# Patient Record
Sex: Female | Born: 1950 | Race: White | Hispanic: No | Smoking: Current every day smoker
Health system: Southern US, Community
[De-identification: ages and names within clinical notes are randomized; demographics above are authoritative.]

## PROBLEM LIST (undated history)

## (undated) DIAGNOSIS — E785 Hyperlipidemia, unspecified: Secondary | ICD-10-CM

## (undated) DIAGNOSIS — R7303 Prediabetes: Secondary | ICD-10-CM

## (undated) DIAGNOSIS — Z972 Presence of dental prosthetic device (complete) (partial): Secondary | ICD-10-CM

## (undated) DIAGNOSIS — F419 Anxiety disorder, unspecified: Secondary | ICD-10-CM

## (undated) DIAGNOSIS — I10 Essential (primary) hypertension: Secondary | ICD-10-CM

## (undated) DIAGNOSIS — F32A Depression, unspecified: Secondary | ICD-10-CM

## (undated) DIAGNOSIS — M12811 Other specific arthropathies, not elsewhere classified, right shoulder: Secondary | ICD-10-CM

## (undated) DIAGNOSIS — Z8489 Family history of other specified conditions: Secondary | ICD-10-CM

## (undated) DIAGNOSIS — K08109 Complete loss of teeth, unspecified cause, unspecified class: Secondary | ICD-10-CM

## (undated) DIAGNOSIS — Z9889 Other specified postprocedural states: Secondary | ICD-10-CM

## (undated) DIAGNOSIS — Z872 Personal history of diseases of the skin and subcutaneous tissue: Secondary | ICD-10-CM

## (undated) DIAGNOSIS — C519 Malignant neoplasm of vulva, unspecified: Secondary | ICD-10-CM

## (undated) DIAGNOSIS — Z85828 Personal history of other malignant neoplasm of skin: Secondary | ICD-10-CM

## (undated) HISTORY — PX: MULTIPLE TOOTH EXTRACTIONS: SHX2053

## (undated) HISTORY — PX: CARPAL TUNNEL RELEASE: SHX101

## (undated) HISTORY — PX: TYMPANOPLASTY: SHX33

---

## 1997-07-18 ENCOUNTER — Other Ambulatory Visit: Admission: RE | Admit: 1997-07-18 | Discharge: 1997-07-18 | Payer: Self-pay | Admitting: Family Medicine

## 2001-07-20 ENCOUNTER — Encounter: Payer: Self-pay | Admitting: Family Medicine

## 2001-07-20 ENCOUNTER — Ambulatory Visit (HOSPITAL_COMMUNITY): Admission: RE | Admit: 2001-07-20 | Discharge: 2001-07-20 | Payer: Self-pay | Admitting: Family Medicine

## 2002-08-02 ENCOUNTER — Ambulatory Visit (HOSPITAL_COMMUNITY): Admission: RE | Admit: 2002-08-02 | Discharge: 2002-08-02 | Payer: Self-pay | Admitting: Family Medicine

## 2002-08-02 ENCOUNTER — Encounter: Payer: Self-pay | Admitting: Family Medicine

## 2003-08-28 ENCOUNTER — Ambulatory Visit (HOSPITAL_COMMUNITY): Admission: RE | Admit: 2003-08-28 | Discharge: 2003-08-28 | Payer: Self-pay | Admitting: Family Medicine

## 2005-04-21 ENCOUNTER — Ambulatory Visit (HOSPITAL_COMMUNITY): Admission: RE | Admit: 2005-04-21 | Discharge: 2005-04-21 | Payer: Self-pay | Admitting: Family Medicine

## 2006-04-26 ENCOUNTER — Ambulatory Visit (HOSPITAL_COMMUNITY): Admission: RE | Admit: 2006-04-26 | Discharge: 2006-04-26 | Payer: Self-pay | Admitting: Family Medicine

## 2006-06-21 ENCOUNTER — Ambulatory Visit (HOSPITAL_COMMUNITY): Admission: RE | Admit: 2006-06-21 | Discharge: 2006-06-21 | Payer: Self-pay | Admitting: Family Medicine

## 2007-05-01 ENCOUNTER — Ambulatory Visit (HOSPITAL_COMMUNITY): Admission: RE | Admit: 2007-05-01 | Discharge: 2007-05-01 | Payer: Self-pay | Admitting: Family Medicine

## 2007-08-25 ENCOUNTER — Ambulatory Visit (HOSPITAL_COMMUNITY): Admission: RE | Admit: 2007-08-25 | Discharge: 2007-08-25 | Payer: Self-pay | Admitting: Family Medicine

## 2008-05-01 ENCOUNTER — Ambulatory Visit (HOSPITAL_COMMUNITY): Admission: RE | Admit: 2008-05-01 | Discharge: 2008-05-01 | Payer: Self-pay | Admitting: Family Medicine

## 2009-05-06 ENCOUNTER — Ambulatory Visit (HOSPITAL_COMMUNITY): Admission: RE | Admit: 2009-05-06 | Discharge: 2009-05-06 | Payer: Self-pay | Admitting: Family Medicine

## 2010-05-11 ENCOUNTER — Ambulatory Visit (HOSPITAL_COMMUNITY)
Admission: RE | Admit: 2010-05-11 | Discharge: 2010-05-11 | Payer: Self-pay | Source: Home / Self Care | Admitting: Family Medicine

## 2010-06-28 ENCOUNTER — Encounter: Payer: Self-pay | Admitting: Family Medicine

## 2011-04-09 ENCOUNTER — Other Ambulatory Visit (HOSPITAL_COMMUNITY): Payer: Self-pay | Admitting: Family Medicine

## 2011-04-09 ENCOUNTER — Other Ambulatory Visit: Payer: Self-pay

## 2011-04-09 DIAGNOSIS — Z139 Encounter for screening, unspecified: Secondary | ICD-10-CM

## 2011-05-13 ENCOUNTER — Ambulatory Visit (HOSPITAL_COMMUNITY)
Admission: RE | Admit: 2011-05-13 | Discharge: 2011-05-13 | Disposition: A | Discharge status: Home / Self Care | Payer: BC Managed Care – PPO | Source: Ambulatory Visit | Attending: Family Medicine | Admitting: Family Medicine

## 2011-05-13 DIAGNOSIS — Z1231 Encounter for screening mammogram for malignant neoplasm of breast: Secondary | ICD-10-CM | POA: Insufficient documentation

## 2011-05-13 DIAGNOSIS — Z139 Encounter for screening, unspecified: Secondary | ICD-10-CM

## 2011-05-29 ENCOUNTER — Encounter: Payer: Self-pay | Admitting: *Deleted

## 2011-05-29 ENCOUNTER — Emergency Department (HOSPITAL_COMMUNITY): Payer: BC Managed Care – PPO

## 2011-05-29 ENCOUNTER — Emergency Department (HOSPITAL_COMMUNITY)
Admission: EM | Admit: 2011-05-29 | Discharge: 2011-05-29 | Disposition: A | Discharge status: Home / Self Care | Payer: BC Managed Care – PPO | Attending: Emergency Medicine | Admitting: Emergency Medicine

## 2011-05-29 DIAGNOSIS — Z79899 Other long term (current) drug therapy: Secondary | ICD-10-CM | POA: Insufficient documentation

## 2011-05-29 DIAGNOSIS — J3489 Other specified disorders of nose and nasal sinuses: Secondary | ICD-10-CM | POA: Insufficient documentation

## 2011-05-29 DIAGNOSIS — I10 Essential (primary) hypertension: Secondary | ICD-10-CM | POA: Insufficient documentation

## 2011-05-29 DIAGNOSIS — R0789 Other chest pain: Secondary | ICD-10-CM | POA: Insufficient documentation

## 2011-05-29 DIAGNOSIS — J069 Acute upper respiratory infection, unspecified: Secondary | ICD-10-CM

## 2011-05-29 DIAGNOSIS — E119 Type 2 diabetes mellitus without complications: Secondary | ICD-10-CM | POA: Insufficient documentation

## 2011-05-29 DIAGNOSIS — R059 Cough, unspecified: Secondary | ICD-10-CM | POA: Insufficient documentation

## 2011-05-29 DIAGNOSIS — F411 Generalized anxiety disorder: Secondary | ICD-10-CM | POA: Insufficient documentation

## 2011-05-29 DIAGNOSIS — R05 Cough: Secondary | ICD-10-CM | POA: Insufficient documentation

## 2011-05-29 DIAGNOSIS — R070 Pain in throat: Secondary | ICD-10-CM | POA: Insufficient documentation

## 2011-05-29 DIAGNOSIS — R509 Fever, unspecified: Secondary | ICD-10-CM | POA: Insufficient documentation

## 2011-05-29 HISTORY — DX: Essential (primary) hypertension: I10

## 2011-05-29 HISTORY — DX: Anxiety disorder, unspecified: F41.9

## 2011-05-29 NOTE — ED Notes (Signed)
Pt has had nasal congestion, cough and cold symptoms since last night. Pt states that she felt shaky this afternoon and broke out in a cold sweat and felt like she was going to pass out. Pt states that she had took Robitussin at 4 pm.

## 2011-05-29 NOTE — ED Provider Notes (Signed)
History     CSN: [id number]  Arrival date & time 05/29/11  4098   First MD Initiated Contact with Patient 05/29/11 1853      Chief Complaint  Patient presents with  . Nasal Congestion    (Consider location/radiation/quality/duration/timing/severity/associated sxs/prior treatment) HPI  Patient relates she started getting a dry cough yesterday morning and it continued yesterday evening. She took Alka-Seltzer plus. Today she started coughing up some white mucus and had a temperature of 99.4 and she took Tylenol and Robitussin which seemed to help.. She's had some clear rhinorrhea, mild sore throat. She states this evening she checked her temperature and it was  93 and she panicked because it was so low. She denies nausea vomiting diarrhea shortness of breath. She states she had some mild chest tightness that   heat made feel better. She has had chills without body aches. She relates she has been around other sick people.  PCP Dr. Doristine Counter  Past Medical History  Diagnosis Date  . Diabetes mellitus   . Hypertension   . Anxiety     Past Surgical History  Procedure Date  . Eye surgery     History reviewed. No pertinent family history.  History  Substance Use Topics  . Smoking status: Current Everyday Smoker -- 1.0 packs/day    Types: Cigarettes  . Smokeless tobacco: Not on file  . Alcohol Use: No  Unemployed   lives with spouse  OB History    Grav Para Term Preterm Abortions TAB SAB Ect Mult Living                  Review of Systems  All other systems reviewed and are negative.    Allergies  Other and Tagamet  Home Medications   Current Outpatient Rx  Name Route Sig Dispense Refill  . ALPRAZOLAM 1 MG PO TABS Oral Take 1 mg by mouth 3 (three) times daily.      Marland Kitchen CALCIUM CARBONATE 600 MG PO TABS Oral Take 600 mg by mouth daily.      Marland Kitchen GEMFIBROZIL 600 MG PO TABS Oral Take 600 mg by mouth 2 (two) times daily.      Marland Kitchen MAGNESIUM 250 MG PO TABS Oral Take 1 tablet by  mouth daily.      Marland Kitchen FISH OIL 1200 MG PO CAPS Oral Take 1 capsule by mouth daily.      Marland Kitchen PIOGLITAZONE HCL-METFORMIN HCL 15-500 MG PO TABS Oral Take 1 tablet by mouth 2 (two) times daily.      Marland Kitchen ROSUVASTATIN CALCIUM 20 MG PO TABS Oral Take 20 mg by mouth daily.      Marland Kitchen VALSARTAN-HYDROCHLOROTHIAZIDE 320-25 MG PO TABS Oral Take 1 tablet by mouth daily.        BP 146/66  Pulse 81  Temp(Src) 97.8 F (36.6 C) (Oral)  Resp 20  Ht 5\' 3"  (1.6 m)  Wt 106 lb (48.081 kg)  BMI 18.78 kg/m2  SpO2 97%  Vital signs normal  Physical Exam  Nursing note and vitals reviewed. Constitutional: She is oriented to person, place, and time. She appears well-developed and well-nourished.  Non-toxic appearance. She does not appear ill. No distress.       No cough observed during my exam  HENT:  Head: Normocephalic and atraumatic.  Right Ear: External ear normal.  Left Ear: External ear normal.  Nose: Nose normal. No mucosal edema or rhinorrhea.  Mouth/Throat: Oropharynx is clear and moist and mucous membranes are normal. No dental  abscesses or uvula swelling.  Eyes: Conjunctivae and EOM are normal. Pupils are equal, round, and reactive to light.  Neck: Normal range of motion and full passive range of motion without pain. Neck supple.  Cardiovascular: Normal rate, regular rhythm and normal heart sounds.  Exam reveals no gallop and no friction rub.   No murmur heard. Pulmonary/Chest: Effort normal and breath sounds normal. No respiratory distress. She has no wheezes. She has no rhonchi. She has no rales. She exhibits no tenderness and no crepitus.  Abdominal: Soft. Normal appearance and bowel sounds are normal. She exhibits no distension. There is no tenderness. There is no rebound and no guarding.  Musculoskeletal: Normal range of motion. She exhibits no edema and no tenderness.       Moves all extremities well.   Neurological: She is alert and oriented to person, place, and time. She has normal strength. No  cranial nerve deficit.  Skin: Skin is warm, dry and intact. No rash noted. No erythema. No pallor.  Psychiatric: She has a normal mood and affect. Her speech is normal and behavior is normal. Her mood appears not anxious.    ED Course  Procedures (including critical care time)  Labs Reviewed  GLUCOSE, CAPILLARY - Abnormal; Notable for the following:    Glucose-Capillary 196 (*)    All other components within normal limits   Dg Chest 2 View  05/29/2011  *RADIOLOGY REPORT*  Clinical Data: Cough, low grade fever.  CHEST - 2 VIEW  Comparison: 03/06/2007  Findings: There is hyperinflation of the lungs compatible with COPD.  Peribronchial thickening.  No confluent opacities or effusions.  No acute bony abnormality.  IMPRESSION: COPD/bronchitis.  Original Report Authenticated By: Cyndie Chime, M.D.     1. Upper respiratory infection    Plan discharge with OTC meds  Devoria Albe, MD, FACEP   MDM          Ward Givens, MD 05/29/11 2149

## 2012-04-07 ENCOUNTER — Other Ambulatory Visit (HOSPITAL_COMMUNITY): Payer: Self-pay | Admitting: Family Medicine

## 2012-04-07 DIAGNOSIS — Z139 Encounter for screening, unspecified: Secondary | ICD-10-CM

## 2012-05-15 ENCOUNTER — Ambulatory Visit (HOSPITAL_COMMUNITY): Payer: BC Managed Care – PPO

## 2012-05-22 ENCOUNTER — Ambulatory Visit (HOSPITAL_COMMUNITY)
Admission: RE | Admit: 2012-05-22 | Discharge: 2012-05-22 | Disposition: A | Discharge status: Home / Self Care | Payer: BC Managed Care – PPO | Source: Ambulatory Visit | Attending: Family Medicine | Admitting: Family Medicine

## 2012-05-22 DIAGNOSIS — Z139 Encounter for screening, unspecified: Secondary | ICD-10-CM

## 2012-05-22 DIAGNOSIS — Z1231 Encounter for screening mammogram for malignant neoplasm of breast: Secondary | ICD-10-CM | POA: Insufficient documentation

## 2013-01-16 ENCOUNTER — Encounter (HOSPITAL_COMMUNITY): Payer: Self-pay | Admitting: *Deleted

## 2013-01-16 ENCOUNTER — Emergency Department (HOSPITAL_COMMUNITY)
Admission: EM | Admit: 2013-01-16 | Discharge: 2013-01-16 | Disposition: A | Discharge status: Home / Self Care | Payer: BC Managed Care – PPO | Attending: Emergency Medicine | Admitting: Emergency Medicine

## 2013-01-16 ENCOUNTER — Emergency Department (HOSPITAL_COMMUNITY): Payer: BC Managed Care – PPO

## 2013-01-16 DIAGNOSIS — E119 Type 2 diabetes mellitus without complications: Secondary | ICD-10-CM | POA: Insufficient documentation

## 2013-01-16 DIAGNOSIS — F17200 Nicotine dependence, unspecified, uncomplicated: Secondary | ICD-10-CM | POA: Insufficient documentation

## 2013-01-16 DIAGNOSIS — S60222A Contusion of left hand, initial encounter: Secondary | ICD-10-CM

## 2013-01-16 DIAGNOSIS — S60229A Contusion of unspecified hand, initial encounter: Secondary | ICD-10-CM | POA: Insufficient documentation

## 2013-01-16 DIAGNOSIS — I10 Essential (primary) hypertension: Secondary | ICD-10-CM | POA: Insufficient documentation

## 2013-01-16 DIAGNOSIS — F411 Generalized anxiety disorder: Secondary | ICD-10-CM | POA: Insufficient documentation

## 2013-01-16 DIAGNOSIS — Z79899 Other long term (current) drug therapy: Secondary | ICD-10-CM | POA: Insufficient documentation

## 2013-01-16 DIAGNOSIS — S62309A Unspecified fracture of unspecified metacarpal bone, initial encounter for closed fracture: Secondary | ICD-10-CM | POA: Insufficient documentation

## 2013-01-16 MED ORDER — OXYCODONE-ACETAMINOPHEN 5-325 MG PO TABS: 2.0000 | ORAL_TABLET | ORAL | Status: DC | PRN

## 2013-01-16 NOTE — ED Notes (Signed)
Bruising and swelling noted to left hand, able to wiggle fingers
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
## 2013-01-17 NOTE — ED Provider Notes (Signed)
Medical screening examination/treatment/procedure(s) were conducted as a shared visit with non-physician practitioner(s) and myself.  I personally evaluated the patient during the encounter.  Nondisplaced fracture of left second metacarpal head. Neurovascular intact.  Donnetta Hutching, MD 01/17/13 [phone]

## 2013-01-18 ENCOUNTER — Encounter: Payer: Self-pay | Admitting: Orthopedic Surgery

## 2013-01-18 ENCOUNTER — Ambulatory Visit (INDEPENDENT_AMBULATORY_CARE_PROVIDER_SITE_OTHER): Payer: BC Managed Care – PPO | Admitting: Orthopedic Surgery

## 2013-01-18 VITALS — BP 128/75 | Ht 62.5 in | Wt 106.0 lb

## 2013-01-18 DIAGNOSIS — IMO0002 Reserved for concepts with insufficient information to code with codable children: Secondary | ICD-10-CM

## 2013-01-18 DIAGNOSIS — S6990XA Unspecified injury of unspecified wrist, hand and finger(s), initial encounter: Secondary | ICD-10-CM

## 2013-01-18 DIAGNOSIS — S63611A Unspecified sprain of left index finger, initial encounter: Secondary | ICD-10-CM | POA: Insufficient documentation

## 2013-01-18 DIAGNOSIS — S6992XA Unspecified injury of left wrist, hand and finger(s), initial encounter: Secondary | ICD-10-CM
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
## 2013-01-19 ENCOUNTER — Ambulatory Visit (HOSPITAL_COMMUNITY)
Admission: RE | Admit: 2013-01-19 | Discharge: 2013-01-19 | Disposition: A | Discharge status: Home / Self Care | Payer: BC Managed Care – PPO | Source: Ambulatory Visit | Attending: Orthopedic Surgery | Admitting: Orthopedic Surgery

## 2013-01-19 DIAGNOSIS — I10 Essential (primary) hypertension: Secondary | ICD-10-CM | POA: Insufficient documentation

## 2013-01-19 DIAGNOSIS — M79609 Pain in unspecified limb: Secondary | ICD-10-CM | POA: Insufficient documentation

## 2013-01-19 DIAGNOSIS — E119 Type 2 diabetes mellitus without complications: Secondary | ICD-10-CM | POA: Insufficient documentation

## 2013-01-19 DIAGNOSIS — Z4689 Encounter for fitting and adjustment of other specified devices: Secondary | ICD-10-CM | POA: Insufficient documentation

## 2013-01-19 DIAGNOSIS — IMO0001 Reserved for inherently not codable concepts without codable children: Secondary | ICD-10-CM | POA: Insufficient documentation

## 2013-01-22 NOTE — Evaluation (Signed)
Occupational Therapy Evaluation  Patient Details  Name: Jeanette Mora MRN: [id number] Date of Birth: [birth year]  Today's Date: 01/22/2013 Time: 1600-1700 OT Time Calculation (min): 60 min OT eval 4098-1191 15' Splint fabrication 1615-1700 45'  Visit#: 1 of 1  Re-eval:       Past Medical History:  Past Medical History  Diagnosis Date  . Diabetes mellitus   . Hypertension   . Anxiety    Past Surgical History:  Past Surgical History  Procedure Laterality Date  . Eye surgery      Subjective Symptoms/Limitations Symptoms: S: I go back in two weeks to see Dr. Romeo Apple.  Pertinent History: Approximately, 4 days ago patient sustained a 2nd digit MCPJ sprain/injury to her left hand. Patient presents to outpatient OT to fabricate a left index finger removable splint with MCP at 70 degrees. Dr. Romeo Apple has referred patient to occupational therapy for evaluation and splint fabrication.  Patient Stated Goals: To have a splint made that is comfortable.   Pain Assessment Currently in Pain?: No/denies   Assessment  01/19/13 0810  Assessment  Diagnosis left index MCP sprain/injury  Next MD Visit 2 weeks - Romeo Apple  Prior Therapy None  Precautions  Precautions None  Balance Screen  Has the patient fallen in the past 6 months No  Home Living  Family/patient expects to be discharged to: Private residence  Living Arrangements Spouse/significant other  Prior Function  Level of Independence Independent with basic ADLs;Independent with gait  Able to Take Stairs? Yes  Driving Yes  Vision - History  Baseline Vision No visual deficits  Cognition  Overall Cognitive Status Within Functional Limits for tasks assessed  Arousal/Alertness Awake/alert  Orientation Level Oriented X4  Observation/Other Assessments  Observations Bruising at left index MCP joint.  Sensation  Light Touch Appears Intact  Coordination  Gross Motor Movements are Fluid and Coordinated No  Fine Motor  Movements are Fluid and Coordinated No  Coordination and Movement Description Decreased FMC and GMC of left index finger due to swelling and pain with movement.   9 Hole Peg Test N/T  Edema  Edema Slight swelling at left index finger.  LUE AROM (degrees)  LUE Overall AROM Comments Shoulder, elbow, and wrist ranges: WNL. Left hand: thumb, middle, ring, and small finger WNL. Index finger decreased range to approx. 50%-75% due to swelling and pain with movement.  Written Expression  Dominant Hand Right   Exercise/Treatments     Splinting Splinting: Left index finger removeable splint fabricated with MCP at 70 degrees. Patient able to functionally use thumb, middle, ring, and small finger while wearing splint. Education completed with patient and husband regarding care management, donning, doffing, and wearing schedule.   Occupational Therapy Assessment and Plan OT Assessment and Plan Clinical Impression Statement: Patient is a 62 y/o female s/p left index finger MCP sprain/injury presenting to outpatient occupational therapy for fabrication of splint.  OT Plan: P: One time eval and visit. Education completed with patient and husband.   Goals Short Term Goals Time to Complete Short Term Goals:  (1 week) Short Term Goal 1: Patient will be educated on splint care management, donning and doffing and wearking schedule. Short Term Goal 1 Progress: Met  Problem List Patient Active Problem List   Diagnosis Date Noted  . Finger injury 01/18/2013  . Sprain of left index finger 01/18/2013    End of Session Activity Tolerance: Patient tolerated treatment well General Behavior During Therapy: [facility] for tasks assessed/performed   Limmie Patricia, OTR/L,CBIS  01/22/2013, 8:20 AM  Physician Documentation Your signature is required to indicate approval of the treatment plan as stated above.  Please sign and either send electronically or make a copy of this report for your files and return  this physician signed original.  Please mark one 1.__approve of plan  2. ___approve of plan with the following conditions.   ______________________________                                                          _____________________ Physician Signature                                                                                                             Date

## 2013-02-01 ENCOUNTER — Ambulatory Visit (INDEPENDENT_AMBULATORY_CARE_PROVIDER_SITE_OTHER): Payer: BC Managed Care – PPO | Admitting: Orthopedic Surgery

## 2013-02-01 ENCOUNTER — Encounter: Payer: Self-pay | Admitting: Orthopedic Surgery

## 2013-02-01 VITALS — BP 134/75 | Ht 62.5 in | Wt 106.0 lb

## 2013-02-01 DIAGNOSIS — S63611D Unspecified sprain of left index finger, subsequent encounter: Secondary | ICD-10-CM

## 2013-02-01 DIAGNOSIS — Z5189 Encounter for other specified aftercare: Secondary | ICD-10-CM

## 2013-02-01 NOTE — Patient Instructions (Signed)
Night time splint + exercises 3 x a day

## 2013-02-01 NOTE — Progress Notes (Signed)
Patient ID: Jeanette Mora, female   DOB: 1950-06-18, 62 y.o.   MRN: [id number]  Chief Complaint  Patient presents with  . Follow-up    2 week recheck left index finger DOI 01/15/13   HISTORY: Twisting injury left metacarpophalangeal joint no fracture a questionable avulsion type ligament injury  Treated with splinting flexion safe position doing well  Review of systems no numbness or tingling  BP 134/75  Ht 5' 2.5" (1.588 m)  Wt 106 lb (48.081 kg)  BMI 19.07 kg/m2 There is mild swelling the joint is slightly stiff but there is 70 of flexion at the MP joint the joint is stable she has good flexion power at the PIP and PIP joints skin is intact there is good capillary refill and normal sensation  Metacarpophalangeal joint sprain  Night splint  I instructed her on home exercises to do 3 times a day followup two-week check range of motion at the metacarpophalangeal joint  Encounter Diagnosis  Name Primary?  . Sprain of left index finger, subsequent encounter Yes

## 2013-02-06 ENCOUNTER — Ambulatory Visit: Payer: BC Managed Care – PPO | Admitting: Orthopedic Surgery

## 2013-02-15 ENCOUNTER — Encounter: Payer: Self-pay | Admitting: Orthopedic Surgery

## 2013-02-15 ENCOUNTER — Ambulatory Visit (INDEPENDENT_AMBULATORY_CARE_PROVIDER_SITE_OTHER): Payer: BC Managed Care – PPO | Admitting: Orthopedic Surgery

## 2013-02-15 VITALS — BP 126/72 | Ht 62.5 in | Wt 106.0 lb

## 2013-02-15 DIAGNOSIS — S63611D Unspecified sprain of left index finger, subsequent encounter: Secondary | ICD-10-CM

## 2013-02-15 DIAGNOSIS — Z5189 Encounter for other specified aftercare: Secondary | ICD-10-CM

## 2013-02-15 NOTE — Patient Instructions (Addendum)
activities as tolerated 

## 2013-02-15 NOTE — Progress Notes (Signed)
Patient ID: Jeanette Mora, female   DOB: 06/18/1950, 62 y.o.   MRN: [id number]  Chief Complaint  Patient presents with  . Follow-up    2 week recheck left index finger DOI 01/15/13     The injury to left index finger has completely resolved. The patient now has full range of motion no tenderness stress tests are normal. She can return to normal activities and followup with [place] as needed

## 2013-05-10 ENCOUNTER — Other Ambulatory Visit (HOSPITAL_COMMUNITY): Payer: Self-pay | Admitting: Family Medicine

## 2013-05-10 DIAGNOSIS — Z139 Encounter for screening, unspecified: Secondary | ICD-10-CM

## 2013-05-24 ENCOUNTER — Ambulatory Visit (HOSPITAL_COMMUNITY)
Admission: RE | Admit: 2013-05-24 | Discharge: 2013-05-24 | Disposition: A | Discharge status: Home / Self Care | Payer: BC Managed Care – PPO | Source: Ambulatory Visit | Attending: Family Medicine | Admitting: Family Medicine

## 2013-05-24 DIAGNOSIS — Z1231 Encounter for screening mammogram for malignant neoplasm of breast: Secondary | ICD-10-CM | POA: Insufficient documentation

## 2013-05-24 DIAGNOSIS — Z139 Encounter for screening, unspecified: Secondary | ICD-10-CM

## 2014-04-29 ENCOUNTER — Other Ambulatory Visit (HOSPITAL_COMMUNITY): Payer: Self-pay | Admitting: Family Medicine

## 2014-04-29 DIAGNOSIS — Z1231 Encounter for screening mammogram for malignant neoplasm of breast: Secondary | ICD-10-CM

## 2014-05-27 ENCOUNTER — Ambulatory Visit (HOSPITAL_COMMUNITY)
Admission: RE | Admit: 2014-05-27 | Discharge: 2014-05-27 | Disposition: A | Discharge status: Home / Self Care | Payer: BC Managed Care – PPO | Source: Ambulatory Visit | Attending: Family Medicine | Admitting: Family Medicine

## 2014-05-27 DIAGNOSIS — Z1231 Encounter for screening mammogram for malignant neoplasm of breast: Secondary | ICD-10-CM | POA: Diagnosis present

## 2015-04-22 ENCOUNTER — Other Ambulatory Visit (HOSPITAL_COMMUNITY): Payer: Self-pay | Admitting: Family Medicine

## 2015-04-22 DIAGNOSIS — Z1231 Encounter for screening mammogram for malignant neoplasm of breast: Secondary | ICD-10-CM

## 2015-04-30 ENCOUNTER — Ambulatory Visit (HOSPITAL_COMMUNITY): Payer: Self-pay

## 2015-05-29 ENCOUNTER — Ambulatory Visit (HOSPITAL_COMMUNITY)
Admission: RE | Admit: 2015-05-29 | Discharge: 2015-05-29 | Disposition: A | Discharge status: Home / Self Care | Payer: Medicare Other | Source: Ambulatory Visit | Attending: Family Medicine | Admitting: Family Medicine

## 2015-05-29 DIAGNOSIS — Z1231 Encounter for screening mammogram for malignant neoplasm of breast: Secondary | ICD-10-CM | POA: Insufficient documentation

## 2016-04-28 ENCOUNTER — Other Ambulatory Visit (HOSPITAL_COMMUNITY): Payer: Self-pay | Admitting: Physician Assistant

## 2016-04-28 DIAGNOSIS — Z1231 Encounter for screening mammogram for malignant neoplasm of breast: Secondary | ICD-10-CM

## 2016-06-02 ENCOUNTER — Ambulatory Visit (HOSPITAL_COMMUNITY)
Admission: RE | Admit: 2016-06-02 | Discharge: 2016-06-02 | Disposition: A | Discharge status: Home / Self Care | Payer: Medicare Other | Source: Ambulatory Visit | Attending: Physician Assistant | Admitting: Physician Assistant

## 2016-06-02 DIAGNOSIS — Z1231 Encounter for screening mammogram for malignant neoplasm of breast: Secondary | ICD-10-CM | POA: Diagnosis present

## 2017-04-26 ENCOUNTER — Other Ambulatory Visit (HOSPITAL_COMMUNITY): Payer: Self-pay | Admitting: Physician Assistant

## 2017-04-26 DIAGNOSIS — Z1231 Encounter for screening mammogram for malignant neoplasm of breast: Secondary | ICD-10-CM

## 2017-06-08 ENCOUNTER — Ambulatory Visit (HOSPITAL_COMMUNITY): Payer: Medicare Other

## 2017-06-09 ENCOUNTER — Ambulatory Visit (HOSPITAL_COMMUNITY)
Admission: RE | Admit: 2017-06-09 | Discharge: 2017-06-09 | Disposition: A | Discharge status: Home / Self Care | Payer: Medicare Other | Source: Ambulatory Visit | Attending: Physician Assistant | Admitting: Physician Assistant

## 2017-06-09 DIAGNOSIS — Z1231 Encounter for screening mammogram for malignant neoplasm of breast: Secondary | ICD-10-CM | POA: Diagnosis present

## 2017-11-11 ENCOUNTER — Encounter: Payer: Self-pay | Admitting: Gynecology

## 2017-11-11 ENCOUNTER — Inpatient Hospital Stay: Payer: Medicare Other | Attending: Gynecology | Admitting: Gynecology

## 2017-11-11 VITALS — BP 136/89 | HR 67 | Temp 97.6°F | Resp 20 | Ht 62.5 in | Wt 128.5 lb

## 2017-11-11 DIAGNOSIS — C519 Malignant neoplasm of vulva, unspecified: Secondary | ICD-10-CM

## 2017-11-11 DIAGNOSIS — C510 Malignant neoplasm of labium majus: Secondary | ICD-10-CM

## 2017-11-11 NOTE — H&P (View-Only) (Signed)
Consult Note: Gyn-Onc   Jeanette Mora 67 y.o. female  Chief Complaint  Patient presents with  . 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  Based on depth of invasion we may subsequently recommend either sentinel node biopsy or superficial inguinal lymphadenectomy as a second procedure.  This is explained to the patient and her daughter.  The risks of surgery reviewed and we will schedule surgery in the near future.  HPI: 67 year old white female comes accompanied by her adult daughter and is seen in consultation at the request of Dr. Kathryne Eriksson and Tiana Loft regarding management of a newly diagnosed basaloid squamous cell carcinoma of the vulva.  Patient presented to her primary care physician having had a lesion present for several months.  A biopsy of the lesion revealed a basaloid squamous cell carcinoma.  Depth of invasion cannot be determined.  Patient denies any past gynecologic history.  Review of Systems:10 point review of systems is negative except as noted in interval history.   Vitals: Blood pressure 136/89, pulse 67, temperature 97.6 F (36.4 C), temperature source Oral, resp. rate 20, height 5' 2.5" (1.588 m), weight 128 lb 8 oz (58.3 kg), SpO2 97 %.  Physical Exam: General : The patient is a healthy woman in no acute distress.  HEENT: normocephalic, extraoccular movements normal; neck is supple without thyromegally  Lynphnodes: Supraclavicular and inguinal nodes not enlarged  Abdomen: Soft, non-tender, no ascites, no organomegally, no masses, no hernias  Pelvic:  EGBUS: Normal female, there is a 1 cm ulcerative lesion  on the right posterior vulva just lateral to the perineum.  This is not indurated.    Lower extremities: No edema or varicosities. Normal range of motion      Allergies  Allergen Reactions  . Other     ? Antibiotic   . Oxycodone Other (See Comments)    Increased heart rate  . Tagamet [Cimetidine] Hives    Past Medical History:  Diagnosis Date  . Anxiety   . Diabetes mellitus   . Hypertension     Past Surgical History:  Procedure Laterality Date  . Carpel Tunnel  about 18 years ago   On both hands  . Ear drum patch  about 8years ago  . EYE SURGERY      Current Outpatient Medications  Medication Sig Dispense Refill  . ALPRAZolam (XANAX) 1 MG tablet Take 1 mg by mouth 3 (three) times daily as needed for anxiety.     Marland Kitchen aspirin EC 81 MG tablet Take 81 mg by mouth daily.    . calcium carbonate (OS-CAL) 600 MG TABS Take 600 mg by mouth daily.      Marland Kitchen gemfibrozil (LOPID) 600 MG tablet Take 600 mg by mouth 2 (two) times daily.      . Magnesium 250 MG TABS Take 1 tablet by mouth daily.      . Omega-3 Fatty Acids (FISH OIL) 1200 MG CAPS Take 1 capsule by mouth daily.      . rosuvastatin (CRESTOR) 20 MG tablet Take 20 mg by mouth daily.      . valsartan-hydrochlorothiazide (DIOVAN-HCT) 320-25 MG  per tablet Take 1 tablet by mouth daily.       No current facility-administered medications for this visit.     Social History   Socioeconomic History  . Marital status: Married    Spouse name: Not on file  . Number of children: Not on file  . Years of education: Not on file  . Highest education level: Not on file  Occupational History  . Not on file  Social Needs  . Financial resource strain: Not on file  . Food insecurity:    Worry: Not on file    Inability: Not on file  . Transportation needs:    Medical: Not on file    Non-medical: Not on file  Tobacco Use  . Smoking status: Current Every Day Smoker    Packs/day: 1.00    Types: Cigarettes  . Smokeless tobacco: Never  Used  Substance and Sexual Activity  . Alcohol use: No  . Drug use: No  . Sexual activity: Not on file  Lifestyle  . Physical activity:    Days per week: Not on file    Minutes per session: Not on file  . Stress: Not on file  Relationships  . Social connections:    Talks on phone: Not on file    Gets together: Not on file    Attends religious service: Not on file    Active member of club or organization: Not on file    Attends meetings of clubs or organizations: Not on file    Relationship status: Not on file  . Intimate partner violence:    Fear of current or ex partner: Not on file    Emotionally abused: Not on file    Physically abused: Not on file    Forced sexual activity: Not on file  Other Topics Concern  . Not on file  Social History Narrative  . Not on file    Family History  Problem Relation Age of Onset  . Leukemia Brother   . Breast cancer Maternal Aunt   . Diabetes Daughter   . Hypertension Daughter   . Cervical cancer Daughter   . Diabetes Daughter       Marti Sleigh, MD 11/11/2017, 2:23 PM

## 2017-11-11 NOTE — Progress Notes (Signed)
Consult Note: Gyn-Onc   Jeanette Mora 67 y.o. female  Chief Complaint  Patient presents with  . 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  Based on depth of invasion we may subsequently recommend either sentinel node biopsy or superficial inguinal lymphadenectomy as a second procedure.  This is explained to the patient and her daughter.  The risks of surgery reviewed and we will schedule surgery in the near future.  HPI: 67 year old white female comes accompanied by her adult daughter and is seen in consultation at the request of Dr. Kathryne Eriksson and Tiana Loft regarding management of a newly diagnosed basaloid squamous cell carcinoma of the vulva.  Patient presented to her primary care physician having had a lesion present for several months.  A biopsy of the lesion revealed a basaloid squamous cell carcinoma.  Depth of invasion cannot be determined.  Patient denies any past gynecologic history.  Review of Systems:10 point review of systems is negative except as noted in interval history.   Vitals: Blood pressure 136/89, pulse 67, temperature 97.6 F (36.4 C), temperature source Oral, resp. rate 20, height 5' 2.5" (1.588 m), weight 128 lb 8 oz (58.3 kg), SpO2 97 %.  Physical Exam: General : The patient is a healthy woman in no acute distress.  HEENT: normocephalic, extraoccular movements normal; neck is supple without thyromegally  Lynphnodes: Supraclavicular and inguinal nodes not enlarged  Abdomen: Soft, non-tender, no ascites, no organomegally, no masses, no hernias  Pelvic:  EGBUS: Normal female, there is a 1 cm ulcerative lesion  on the right posterior vulva just lateral to the perineum.  This is not indurated.    Lower extremities: No edema or varicosities. Normal range of motion      Allergies  Allergen Reactions  . Other     ? Antibiotic   . Oxycodone Other (See Comments)    Increased heart rate  . Tagamet [Cimetidine] Hives    Past Medical History:  Diagnosis Date  . Anxiety   . Diabetes mellitus   . Hypertension     Past Surgical History:  Procedure Laterality Date  . Carpel Tunnel  about 18 years ago   On both hands  . Ear drum patch  about 8years ago  . EYE SURGERY      Current Outpatient Medications  Medication Sig Dispense Refill  . ALPRAZolam (XANAX) 1 MG tablet Take 1 mg by mouth 3 (three) times daily as needed for anxiety.     Marland Kitchen aspirin EC 81 MG tablet Take 81 mg by mouth daily.    . calcium carbonate (OS-CAL) 600 MG TABS Take 600 mg by mouth daily.      Marland Kitchen gemfibrozil (LOPID) 600 MG tablet Take 600 mg by mouth 2 (two) times daily.      . Magnesium 250 MG TABS Take 1 tablet by mouth daily.      . Omega-3 Fatty Acids (FISH OIL) 1200 MG CAPS Take 1 capsule by mouth daily.      . rosuvastatin (CRESTOR) 20 MG tablet Take 20 mg by mouth daily.      . valsartan-hydrochlorothiazide (DIOVAN-HCT) 320-25 MG  per tablet Take 1 tablet by mouth daily.       No current facility-administered medications for this visit.     Social History   Socioeconomic History  . Marital status: Married    Spouse name: Not on file  . Number of children: Not on file  . Years of education: Not on file  . Highest education level: Not on file  Occupational History  . Not on file  Social Needs  . Financial resource strain: Not on file  . Food insecurity:    Worry: Not on file    Inability: Not on file  . Transportation needs:    Medical: Not on file    Non-medical: Not on file  Tobacco Use  . Smoking status: Current Every Day Smoker    Packs/day: 1.00    Types: Cigarettes  . Smokeless tobacco: Never  Used  Substance and Sexual Activity  . Alcohol use: No  . Drug use: No  . Sexual activity: Not on file  Lifestyle  . Physical activity:    Days per week: Not on file    Minutes per session: Not on file  . Stress: Not on file  Relationships  . Social connections:    Talks on phone: Not on file    Gets together: Not on file    Attends religious service: Not on file    Active member of club or organization: Not on file    Attends meetings of clubs or organizations: Not on file    Relationship status: Not on file  . Intimate partner violence:    Fear of current or ex partner: Not on file    Emotionally abused: Not on file    Physically abused: Not on file    Forced sexual activity: Not on file  Other Topics Concern  . Not on file  Social History Narrative  . Not on file    Family History  Problem Relation Age of Onset  . Leukemia Brother   . Breast cancer Maternal Aunt   . Diabetes Daughter   . Hypertension Daughter   . Cervical cancer Daughter   . Diabetes Daughter       Marti Sleigh, MD 11/11/2017, 2:23 PM

## 2017-11-11 NOTE — Patient Instructions (Signed)
Plan to have a partial vulvectomy at the [facility] on June 11. 2019 with Dr. Everitt Amber.  You will receive a phone call from the pre-surgical RN to discuss instructions.  Please call for any questions or concerns.   Vulvectomy  Vulvectomy is a surgical procedure to remove all or part of the outer female genital organs (vulva). The vulva includes the outer and inner lips of the vagina and the clitoris. You may need this surgery if you have a cancerous growth in your vulva. There are two types of vulvectomy:  A simple vulvectomy. This is the removal of the entire vulva.  A radical vulvectomy. A radical vulvectomy can be partial or complete. ? A partial radical vulvectomy is when part of the vulva and surrounding deep tissue is removed. ? A complete radical vulvectomy is when the vulva, clitoris, and surrounding deep tissue is removed.  During a radical vulvectomy, some lymph nodes near the vulva may also be removed. Tell a health care provider about:  Any allergies you have.  All medicines you are taking, including vitamins, herbs, eye drops, creams, and over-the-counter medicines.  Any problems you or family members have had with anesthetic medicines.  Any blood disorders you have.  Any surgeries you have had.  Any medical conditions you have.  Whether you are pregnant or may be pregnant. What are the risks? Generally, this is a safe procedure. However, problems may occur, including:  Infection.  Bleeding.  Allergic reactions to medicines.  Damage to other structures or organs.  Urinary tract infections.  Lymphedema. This is when your legs swell after the removal of lymph nodes from your groin area.  Pain or decreased sexual pleasure when having sex.  Long-term vaginal swelling, tightness, numbness, or pain.  A blood clot that may travel to the lung (pulmonary embolism).  What happens before the procedure?  Follow instructions from your  health care provider about eating or drinking restrictions.  Ask your health care provider about: ? Changing or stopping your regular medicines. This is especially important if you are taking diabetes medicines or blood thinners. ? Taking medicines such as aspirin and ibuprofen. These medicines can thin your blood. Do not take these medicines before your procedure if your health care provider instructs you not to.  Ask your health care provider how your surgical site will be marked or identified.  You may be given antibiotic medicine to help prevent infection.  Plan to have someone take you home after the procedure.  If you will be going home right after the procedure, plan to have someone with you for 24 hours. What happens during the procedure?  To reduce your risk of infection: ? Your health care team will wash or sanitize their hands. ? Your skin will be washed with soap.  An IV tube will be inserted into one of your veins.  You will be given one or more of the following: ? A medicine to help you relax (sedative). ? A medicine to make you fall asleep (general anesthetic). ? A medicine that is injected into your spine to numb the area below and slightly above the injection site (spinal anesthetic).  A tube (catheter) may be inserted through the outer opening of your bladder (urethra) to drain urine during and after surgery.  Depending on the type of vulvectomy you are having, your surgeon will make an incision and remove the affected area. This may include: ? Removing the entire vulva. ? Removing part  of the vulva, surrounding deep tissue, and lymph nodes. ? Removing the vulva, clitoris, surrounding deep tissue, and lymph nodes.  If your lymph nodes are removed, a drain may be placed in the area to help avoid fluid buildup.  Your incisions will be closed and covered with a bandage (dressing). The procedure may vary among health care providers and hospitals. What happens after  the procedure?  Your blood pressure, heart rate, breathing rate, and blood oxygen level will be monitored often until the medicines you were given have worn off.  You will get medicine for pain as needed.  You may get medicine to prevent constipation.  You may be on a liquid diet at first, and then switch to a regular diet.  When you are taking fluids well, your IV will be removed.  If your catheter was left in place after surgery, it will be removed when your health care provider approves.  You will be asked to breathe deeply and to get out of bed and walk as soon as you can.  You may have to wear compression stockings. These stockings help to prevent blood clots and reduce swelling in your legs.  Do not drive for 24 hours if you received a sedative. This information is not intended to replace advice given to you by your health care provider. Make sure you discuss any questions you have with your health care provider. Document Released: 06/20/2015 Document Revised: 10/30/2015 Document Reviewed: 05/19/2015 Elsevier Interactive Patient Education  [employer].

## 2017-11-14 ENCOUNTER — Encounter (HOSPITAL_BASED_OUTPATIENT_CLINIC_OR_DEPARTMENT_OTHER): Payer: Self-pay | Admitting: *Deleted

## 2017-11-14 ENCOUNTER — Other Ambulatory Visit: Payer: Self-pay

## 2017-11-14 NOTE — Progress Notes (Signed)
Spoke w/ pt via phone for pre-op interview.  Npo after mn.  Arrive at 0700.  Needs istat 8 and ekg.  Will take crestor and gembibrozol am dos w/ sips of water.

## 2017-11-14 NOTE — Anesthesia Preprocedure Evaluation (Addendum)
Anesthesia Evaluation  Patient identified by MRN, date of birth, ID band Patient awake    Reviewed: Allergy & Precautions, NPO status , Patient's Chart, lab work & pertinent test results  Airway Mallampati: I  TM Distance: >3 FB Neck ROM: Full    Dental no notable dental hx. (+) Dental Advisory Given, Lower Dentures, Upper Dentures   Pulmonary Current Smoker,    Pulmonary exam normal breath sounds clear to auscultation       Cardiovascular hypertension, Pt. on medications negative cardio ROS Normal cardiovascular exam Rhythm:Regular Rate:Normal     Neuro/Psych    GI/Hepatic negative GI ROS, Neg liver ROS,   Endo/Other  negative endocrine ROS  Renal/GU negative Renal ROS     Musculoskeletal   Abdominal   Peds  Hematology   Anesthesia Other Findings   Reproductive/Obstetrics                           No results found for: WBC, HGB, HCT, MCV, PLT  Anesthesia Physical Anesthesia Plan  ASA: III  Anesthesia Plan: General   Post-op Pain Management:    Induction: Intravenous  PONV Risk Score and Plan: 3 and Treatment may vary due to age or medical condition, Ondansetron, Dexamethasone and Scopolamine patch - Pre-op  Airway Management Planned: LMA  Additional Equipment:   Intra-op Plan:   Post-operative Plan:   Informed Consent: I have reviewed the patients History and Physical, chart, labs and discussed the procedure including the risks, benefits and alternatives for the proposed anesthesia with the patient or authorized representative who has indicated his/her understanding and acceptance.     Plan Discussed with:   Anesthesia Plan Comments:        Anesthesia Quick Evaluation

## 2017-11-15 ENCOUNTER — Encounter (HOSPITAL_BASED_OUTPATIENT_CLINIC_OR_DEPARTMENT_OTHER): Payer: Self-pay | Admitting: *Deleted

## 2017-11-15 ENCOUNTER — Telehealth: Payer: Self-pay | Admitting: Oncology

## 2017-11-15 ENCOUNTER — Ambulatory Visit (HOSPITAL_BASED_OUTPATIENT_CLINIC_OR_DEPARTMENT_OTHER)
Admission: RE | Admit: 2017-11-15 | Discharge: 2017-11-15 | Disposition: A | Discharge status: Home / Self Care | Payer: Medicare Other | Source: Ambulatory Visit | Attending: Gynecologic Oncology | Admitting: Gynecologic Oncology

## 2017-11-15 ENCOUNTER — Other Ambulatory Visit: Payer: Self-pay | Admitting: Gynecologic Oncology

## 2017-11-15 ENCOUNTER — Ambulatory Visit: Payer: Medicare Other | Admitting: Gynecologic Oncology

## 2017-11-15 ENCOUNTER — Ambulatory Visit (HOSPITAL_BASED_OUTPATIENT_CLINIC_OR_DEPARTMENT_OTHER): Payer: Medicare Other | Admitting: Anesthesiology

## 2017-11-15 ENCOUNTER — Encounter (HOSPITAL_BASED_OUTPATIENT_CLINIC_OR_DEPARTMENT_OTHER)
Admission: RE | Disposition: A | Discharge status: Home / Self Care | Payer: Self-pay | Source: Ambulatory Visit | Attending: Gynecologic Oncology

## 2017-11-15 DIAGNOSIS — F419 Anxiety disorder, unspecified: Secondary | ICD-10-CM | POA: Diagnosis not present

## 2017-11-15 DIAGNOSIS — F17210 Nicotine dependence, cigarettes, uncomplicated: Secondary | ICD-10-CM | POA: Diagnosis not present

## 2017-11-15 DIAGNOSIS — Z7982 Long term (current) use of aspirin: Secondary | ICD-10-CM | POA: Insufficient documentation

## 2017-11-15 DIAGNOSIS — I10 Essential (primary) hypertension: Secondary | ICD-10-CM | POA: Diagnosis not present

## 2017-11-15 DIAGNOSIS — L98419 Non-pressure chronic ulcer of buttock with unspecified severity: Secondary | ICD-10-CM | POA: Diagnosis not present

## 2017-11-15 DIAGNOSIS — Z79899 Other long term (current) drug therapy: Secondary | ICD-10-CM | POA: Diagnosis not present

## 2017-11-15 DIAGNOSIS — Z885 Allergy status to narcotic agent status: Secondary | ICD-10-CM | POA: Insufficient documentation

## 2017-11-15 DIAGNOSIS — C510 Malignant neoplasm of labium majus: Secondary | ICD-10-CM | POA: Diagnosis present

## 2017-11-15 DIAGNOSIS — E119 Type 2 diabetes mellitus without complications: Secondary | ICD-10-CM | POA: Insufficient documentation

## 2017-11-15 DIAGNOSIS — C519 Malignant neoplasm of vulva, unspecified: Secondary | ICD-10-CM

## 2017-11-15 DIAGNOSIS — Z888 Allergy status to other drugs, medicaments and biological substances status: Secondary | ICD-10-CM | POA: Diagnosis not present

## 2017-11-15 HISTORY — DX: Complete loss of teeth, unspecified cause, unspecified class: K08.109

## 2017-11-15 HISTORY — DX: Personal history of diseases of the skin and subcutaneous tissue: Z87.2

## 2017-11-15 HISTORY — DX: Family history of other specified conditions: Z84.89

## 2017-11-15 HISTORY — DX: Presence of dental prosthetic device (complete) (partial): Z97.2

## 2017-11-15 HISTORY — DX: Other specified postprocedural states: Z98.890

## 2017-11-15 HISTORY — DX: Malignant neoplasm of vulva, unspecified: C51.9

## 2017-11-15 HISTORY — DX: Personal history of other malignant neoplasm of skin: Z85.828

## 2017-11-15 HISTORY — DX: Complete loss of teeth, unspecified cause, unspecified class: Z97.2

## 2017-11-15 HISTORY — PX: VULVECTOMY PARTIAL: SHX6187

## 2017-11-15 HISTORY — DX: Prediabetes: R73.03

## 2017-11-15 HISTORY — DX: Other specific arthropathies, not elsewhere classified, right shoulder: M12.811

## 2017-11-15 HISTORY — DX: Hyperlipidemia, unspecified: E78.5

## 2017-11-15 LAB — POCT I-STAT, CHEM 8
BUN: 15 mg/dL (ref 6–20)
CALCIUM ION: 1.18 mmol/L (ref 1.15–1.40)
CHLORIDE: 103 mmol/L (ref 101–111)
CREATININE: 1 mg/dL (ref 0.44–1.00)
Glucose, Bld: 119 mg/dL — ABNORMAL HIGH (ref 65–99)
HEMATOCRIT: 37 % (ref 36.0–46.0)
Hemoglobin: 12.6 g/dL (ref 12.0–15.0)
Potassium: 4 mmol/L (ref 3.5–5.1)
SODIUM: 142 mmol/L (ref 135–145)
TCO2: 28 mmol/L (ref 22–32)

## 2017-11-15 SURGERY — VULVECTOMY, PARTIAL
Anesthesia: General | Site: Vulva

## 2017-11-15 MED ORDER — LIDOCAINE 2% (20 MG/ML) 5 ML SYRINGE
INTRAMUSCULAR | Status: DC | PRN
  Administered 2017-11-15: 50 mg via INTRAVENOUS

## 2017-11-15 MED ORDER — ONDANSETRON HCL 4 MG/2ML IJ SOLN
INTRAMUSCULAR | Status: AC
  Filled 2017-11-15: qty 2

## 2017-11-15 MED ORDER — IBUPROFEN 600 MG PO TABS: 600.0000 mg | ORAL_TABLET | Freq: Four times a day (QID) | ORAL | 0 refills | Status: DC | PRN

## 2017-11-15 MED ORDER — ACETIC ACID 5 % SOLN
Status: DC | PRN
  Administered 2017-11-15: 1 via TOPICAL

## 2017-11-15 MED ORDER — TRAMADOL HCL 50 MG PO TABS: 50.0000 mg | ORAL_TABLET | Freq: Four times a day (QID) | ORAL | 0 refills | Status: DC | PRN

## 2017-11-15 MED ORDER — FENTANYL CITRATE (PF) 100 MCG/2ML IJ SOLN
INTRAMUSCULAR | Status: AC
  Filled 2017-11-15: qty 2

## 2017-11-15 MED ORDER — GABAPENTIN 300 MG PO CAPS
300.0000 mg | ORAL_CAPSULE | Freq: Once | ORAL | Status: AC
  Administered 2017-11-15: 300 mg via ORAL
  Filled 2017-11-15: qty 1

## 2017-11-15 MED ORDER — HYDROCODONE-ACETAMINOPHEN 7.5-325 MG PO TABS
1.0000 | ORAL_TABLET | Freq: Once | ORAL | Status: DC | PRN
  Filled 2017-11-15: qty 1

## 2017-11-15 MED ORDER — GLYCOPYRROLATE PF 0.2 MG/ML IJ SOSY
PREFILLED_SYRINGE | INTRAMUSCULAR | Status: AC
  Filled 2017-11-15: qty 1

## 2017-11-15 MED ORDER — EPHEDRINE SULFATE 50 MG/ML IJ SOLN
INTRAMUSCULAR | Status: DC | PRN
  Administered 2017-11-15: 10 mg via INTRAVENOUS

## 2017-11-15 MED ORDER — MIDAZOLAM HCL 2 MG/2ML IJ SOLN
INTRAMUSCULAR | Status: AC
  Filled 2017-11-15: qty 2

## 2017-11-15 MED ORDER — PROPOFOL 10 MG/ML IV BOLUS
INTRAVENOUS | Status: DC | PRN
  Administered 2017-11-15: 130 mg via INTRAVENOUS

## 2017-11-15 MED ORDER — LIDOCAINE 2% (20 MG/ML) 5 ML SYRINGE
INTRAMUSCULAR | Status: AC
  Filled 2017-11-15: qty 5

## 2017-11-15 MED ORDER — ONDANSETRON HCL 4 MG/2ML IJ SOLN
INTRAMUSCULAR | Status: DC | PRN
  Administered 2017-11-15: 4 mg via INTRAVENOUS

## 2017-11-15 MED ORDER — PROMETHAZINE HCL 25 MG/ML IJ SOLN
6.2500 mg | INTRAMUSCULAR | Status: DC | PRN
  Filled 2017-11-15: qty 1

## 2017-11-15 MED ORDER — BUPIVACAINE LIPOSOME 1.3 % IJ SUSP
INTRAMUSCULAR | Status: AC
  Filled 2017-11-15: qty 20

## 2017-11-15 MED ORDER — GABAPENTIN 300 MG PO CAPS
ORAL_CAPSULE | ORAL | Status: AC
  Filled 2017-11-15: qty 1

## 2017-11-15 MED ORDER — BUPIVACAINE LIPOSOME 1.3 % IJ SUSP
INTRAMUSCULAR | Status: DC | PRN
  Administered 2017-11-15: 8.5 mL

## 2017-11-15 MED ORDER — FENTANYL CITRATE (PF) 100 MCG/2ML IJ SOLN
INTRAMUSCULAR | Status: DC | PRN
  Administered 2017-11-15: 50 ug via INTRAVENOUS

## 2017-11-15 MED ORDER — DEXAMETHASONE SODIUM PHOSPHATE 10 MG/ML IJ SOLN
INTRAMUSCULAR | Status: AC
  Filled 2017-11-15: qty 1

## 2017-11-15 MED ORDER — SENNA 8.6 MG PO TABS: 1.0000 | ORAL_TABLET | Freq: Every day | ORAL | 0 refills | Status: DC

## 2017-11-15 MED ORDER — ACETAMINOPHEN 325 MG PO TABS
650.0000 mg | ORAL_TABLET | Freq: Once | ORAL | Status: AC
  Administered 2017-11-15: 650 mg via ORAL
  Filled 2017-11-15: qty 2

## 2017-11-15 MED ORDER — SCOPOLAMINE 1 MG/3DAYS TD PT72
MEDICATED_PATCH | TRANSDERMAL | Status: AC
  Filled 2017-11-15: qty 1

## 2017-11-15 MED ORDER — LIDOCAINE HCL 1 % IJ SOLN
INTRAMUSCULAR | Status: DC | PRN
  Administered 2017-11-15: 8.5 mL

## 2017-11-15 MED ORDER — LACTATED RINGERS IV SOLN
INTRAVENOUS | Status: DC
  Administered 2017-11-15 (×2): via INTRAVENOUS
  Filled 2017-11-15: qty 1000

## 2017-11-15 MED ORDER — ACETAMINOPHEN 325 MG PO TABS
ORAL_TABLET | ORAL | Status: AC
  Filled 2017-11-15: qty 2

## 2017-11-15 MED ORDER — PROPOFOL 10 MG/ML IV BOLUS
INTRAVENOUS | Status: AC
  Filled 2017-11-15: qty 20

## 2017-11-15 MED ORDER — MEPERIDINE HCL 25 MG/ML IJ SOLN
6.2500 mg | INTRAMUSCULAR | Status: DC | PRN
  Filled 2017-11-15: qty 1

## 2017-11-15 MED ORDER — DEXAMETHASONE SODIUM PHOSPHATE 10 MG/ML IJ SOLN
INTRAMUSCULAR | Status: DC | PRN
  Administered 2017-11-15: 10 mg via INTRAVENOUS

## 2017-11-15 MED ORDER — MIDAZOLAM HCL 2 MG/2ML IJ SOLN
INTRAMUSCULAR | Status: DC | PRN
  Administered 2017-11-15: 2 mg via INTRAVENOUS

## 2017-11-15 MED ORDER — HYDROMORPHONE HCL 1 MG/ML IJ SOLN
0.2500 mg | INTRAMUSCULAR | Status: DC | PRN
  Filled 2017-11-15: qty 0.5

## 2017-11-15 MED ORDER — ACETAMINOPHEN 10 MG/ML IV SOLN
1000.0000 mg | Freq: Once | INTRAVENOUS | Status: DC | PRN
  Filled 2017-11-15: qty 100

## 2017-11-15 MED ORDER — GLYCOPYRROLATE 0.2 MG/ML IJ SOLN
INTRAMUSCULAR | Status: DC | PRN
  Administered 2017-11-15: .2 mg via INTRAVENOUS

## 2017-11-15 MED ORDER — SCOPOLAMINE 1 MG/3DAYS TD PT72
1.0000 | MEDICATED_PATCH | TRANSDERMAL | Status: DC
  Filled 2017-11-15: qty 1

## 2017-11-15 SURGICAL SUPPLY — 22 items
BLADE CLIPPER SURG (BLADE) IMPLANT
BLADE SURG 15 STRL LF DISP TIS (BLADE) ×1 IMPLANT
BLADE SURG 15 STRL SS (BLADE) ×2
CANISTER SUCT 3000ML PPV (MISCELLANEOUS) ×2 IMPLANT
CATH ROBINSON RED A/P 14FR (CATHETERS) ×2 IMPLANT
GLOVE BIO SURGEON STRL SZ 6 (GLOVE) ×4 IMPLANT
KIT TURNOVER CYSTO (KITS) ×2 IMPLANT
NDL HYPO 25X1 1.5 SAFETY (NEEDLE) ×1 IMPLANT
NEEDLE HYPO 25X1 1.5 SAFETY (NEEDLE) ×2 IMPLANT
NS IRRIG 500ML POUR BTL (IV SOLUTION) ×2 IMPLANT
PACK PERINEAL COLD (PAD) ×2 IMPLANT
PACK VAGINAL WOMENS (CUSTOM PROCEDURE TRAY) ×2 IMPLANT
SUT VIC AB 0 SH 27 (SUTURE) ×2 IMPLANT
SUT VIC AB 2-0 CT2 27 (SUTURE) IMPLANT
SUT VIC AB 2-0 SH 27 (SUTURE)
SUT VIC AB 2-0 SH 27XBRD (SUTURE) IMPLANT
SUT VIC AB 3-0 SH 27 (SUTURE) ×4
SUT VIC AB 3-0 SH 27X BRD (SUTURE) ×1 IMPLANT
SUT VICRYL 4-0 PS2 18IN ABS (SUTURE) ×2 IMPLANT
SYR BULB IRRIGATION 50ML (SYRINGE) ×2 IMPLANT
TOWEL OR 17X24 6PK STRL BLUE (TOWEL DISPOSABLE) ×4 IMPLANT
WATER STERILE IRR 500ML POUR (IV SOLUTION) IMPLANT

## 2017-11-15 NOTE — Anesthesia Procedure Notes (Signed)
Procedure Name: LMA Insertion Date/Time: 11/15/2017 9:26 AM Performed by: Suan Halter, CRNA Pre-anesthesia Checklist: Patient identified, Emergency Drugs available, Suction available and Patient being monitored Patient Re-evaluated:Patient Re-evaluated prior to induction Oxygen Delivery Method: Circle system utilized Preoxygenation: Pre-oxygenation with 100% oxygen Induction Type: IV induction Ventilation: Mask ventilation without difficulty LMA: LMA inserted LMA Size: 4.0 Number of attempts: 1 Airway Equipment and Method: Bite block Placement Confirmation: positive ETCO2 Tube secured with: Tape Dental Injury: Teeth and Oropharynx as per pre-operative assessment

## 2017-11-15 NOTE — Interval H&P Note (Signed)
History and Physical Interval Note:  11/15/2017 9:08 AM  Jeanette Mora  has presented today for surgery, with the diagnosis of vulvar cancer  The various methods of treatment have been discussed with the patient and family. After consideration of risks, benefits and other options for treatment, the patient has consented to  Procedure(s): VULVECTOMY PARTIAL (N/A) as a surgical intervention .  The patient's history has been reviewed, patient examined, no change in status, stable for surgery.  I have reviewed the patient's chart and labs.  Questions were answered to the patient's satisfaction.     Thereasa Solo

## 2017-11-15 NOTE — Interval H&P Note (Signed)
History and Physical Interval Note:  11/15/2017 8:56 AM  Jeanette Mora  has presented today for surgery, with the diagnosis of vulvar cancer  The various methods of treatment have been discussed with the patient and family. After consideration of risks, benefits and other options for treatment, the patient has consented to  Procedure(s): VULVECTOMY PARTIAL (N/A) as a surgical intervention .  The patient's history has been reviewed, patient examined, no change in status, stable for surgery.  I have reviewed the patient's chart and labs.  Questions were answered to the patient's satisfaction.     Thereasa Solo

## 2017-11-15 NOTE — Discharge Instructions (Signed)
Vulvectomy, Care After °The vulva is the external female genitalia, outside and around the vagina and pubic bone. It consists of: °· The skin on, and in front of, the pubic bone. °· The clitoris. °· The labia majora (large lips) on the outside of the vagina. °· The labia minora (small lips) around the opening of the vagina. °· The opening and the skin in and around the vagina. °A vulvectomy is the removal of the tissue of the vulva, which sometimes includes removal of the lymph nodes and tissue in the groin areas. °These discharge instructions provide you with general information on caring for yourself after you leave the hospital. It is also important that you know the warning signs of complications, so that you can seek treatment. Please read the instructions outlined below and refer to this sheet in the next few weeks. Your caregiver may also give you specific information and medicines. If you have any questions or complications after discharge, please call your caregiver. °ACTIVITY °· Rest as much as possible the first two weeks after discharge. °· Arrange to have help from family or others with your daily activities when you go home. °· Avoid heavy lifting (more than 5 pounds), pushing, or pulling. °· If you feel tired, balance your activity with rest periods. °· Follow your caregiver's instruction about climbing stairs and driving a car. °· Increase activity gradually. °· Do not exercise until you have permission from your caregiver. °LEG AND FOOT CARE °If your doctor has removed lymph nodes from your groin area, there may be an increase in swelling of your legs and feet. You can help prevent swelling by doing the following: °· Elevate your legs while sitting or lying down. °· If your caregiver has ordered special stockings, wear them according to instructions. °· Avoid standing in one place for long periods of time. °· Call the physical therapy department if you have any questions about swelling or treatment  for swelling. °· Avoid salt in your diet. It can cause fluid retention and swelling. °· Do not cross your legs, especially when sitting. °NUTRITION °· You may resume your normal diet. °· Drink 6 to 8 glasses of fluids a day. °· Eat a healthy, balanced diet including portions of food from the meat (protein), milk, fruit, vegetable, and bread groups. °· Your caregiver may recommend you take a multivitamin with iron. °ELIMINATION °· You may notice that your stream of urine is at a different angle, and may tend to spray. Using a plastic funnel may help to decrease urine spray. °· If constipation occurs, drink more liquids, and add more fruits, vegetables, and bran to your diet. You may take a mild laxative, such as Milk of Magnesia, Metamucil, or a stool softener such as Colace, with permission from your caregiver. °HYGIENE °· You may shower and wash your hair. °· Check with your caregiver about tub baths. °· Do not add any bath oils or chemicals to your bath water, after you have permission to take baths. °· While passing urine, pour water from a bottle or spray over your vulva to dilute the urine as it passes the incision (this will decrease burning and discomfort). °· Clean yourself well after moving your bowels. °· After urinating, do not wipe. Dap or pat dry with toilet paper or a dry cleath soft cloth. °· A sitz bath will help keep your perineal area clean, reduce swelling, and provide comfort. °· Avoid wearing underpants for the first 2 weeks and wear loose skirts to   allow circulation of air around the incision °· You do not need to apply dressings, salves or lotions to the wound. °· The stitches are self-dissolving and will absorb and disappear over a couple of months (it is normal to notice the knot from the stitches on toilet paper after voiding). °HOME CARE INSTRUCTIONS  °· Apply a soft ice pack (or frozen bag of peas) to your perineum (vulva) every hour in the first 48 hours after surgery. This will reduce  swelling. °· Avoid activities that involve a lot of friction between your legs. °· Avoid wearing pants or underpants in the 1st 2 weeks (skirts are preferable). °· Take your temperature twice a day and record it, especially if you feel feverish or have chills. °· Follow your caregiver's instructions about medicines, activity, and follow-up appointments after surgery. °· Do not drink alcohol while taking pain medicine. °· Change your dressing as advised by your caregiver. °· You may take over-the-counter medicine for pain, recommended by your caregiver. °· If your pain is not relieved with medicine, call your caregiver. °· Do not take aspirin because it can cause bleeding. °· Do not douche or use tampons (use a nonperfumed sanitary pad). °· Do not have sexual intercourse until your caregiver gives you permission (typically 6 weeks postoperatively). Hugging, kissing, and playful sexual activity is fine with your caregiver's permission. °· Warm sitz baths, with your caregiver's permission, are helpful to control swelling and discomfort. °· Take showers instead of baths, until your caregiver gives you permission to take baths. °· You may take a mild medicine for constipation, recommended by your caregiver. Bran foods and drinking a lot of fluids will help with constipation. °· Make sure your family understands everything about your operation and recovery. °SEEK MEDICAL CARE IF:  °· You notice swelling and redness around the wound area. °· You notice a foul smell coming from the wound or on the surgical dressing. °· You notice the wound is separating. °· You have painful or bloody urination. °· You develop nausea and vomiting. °· You develop diarrhea. °· You develop a rash. °· You have a reaction or allergy from the medicine. °· You feel dizzy or light-headed. °· You need stronger pain medicine. °SEEK IMMEDIATE MEDICAL CARE IF:  °· You develop a temperature of 102° F (38.9° C) or higher. °· You pass out. °· You develop  leg or chest pain. °· You develop abdominal pain. °· You develop shortness of breath. °· You develop bleeding from the wound area. °· You see pus in the wound area. °MAKE SURE YOU:  °· Understand these instructions. °· Will watch your condition. °· Will get help right away if you are not doing well or get worse. °Document Released: 01/06/2004 Document Revised: 10/08/2013 Document Reviewed: 04/25/2009 °Exit[employer]® Patient Information ©2015 [employer]. This information is not intended to replace advice given to you by your health care provider. Make sure you discuss any questions you have with your health care provider. ° ° °Post Anesthesia Home Care Instructions ° °Activity: °Get plenty of rest for the remainder of the day. A responsible individual must stay with you for 24 hours following the procedure.  °For the next 24 hours, DO NOT: °-Drive a car °-Operate machinery °-Drink alcoholic beverages °-Take any medication unless instructed by your physician °-Make any legal decisions or sign important papers. ° °Meals: °Start with liquid foods such as gelatin or soup. Progress to regular foods as tolerated. Avoid greasy, spicy, heavy foods. If nausea and/or vomiting   occur, drink only clear liquids until the nausea and/or vomiting subsides. Call your physician if vomiting continues.  Special Instructions/Symptoms: Your throat may feel dry or sore from the anesthesia or the breathing tube placed in your throat during surgery. If this causes discomfort, gargle with warm salt water. The discomfort should disappear within 24 hours.  If you had a scopolamine patch placed behind your ear for the management of post- operative nausea and/or vomiting:  1. The medication in the patch is effective for 72 hours, after which it should be removed.  Wrap patch in a tissue and discard in the trash. Wash hands thoroughly with soap and water. 2. You may remove the patch earlier than 72 hours if you experience unpleasant side  effects which may include dry mouth, dizziness or visual disturbances. 3. Avoid touching the patch. Wash your hands with soap and water after contact with the patch.   Information for Discharge Teaching: EXPAREL (bupivacaine liposome injectable suspension)   Your surgeon gave you EXPAREL(bupivacaine) in your surgical incision to help control your pain after surgery.   EXPAREL is a local anesthetic that provides pain relief by numbing the tissue around the surgical site.  EXPAREL is designed to release pain medication over time and can control pain for up to 72 hours.  Depending on how you respond to EXPAREL, you may require less pain medication during your recovery.  Possible side effects:  Temporary loss of sensation or ability to move in the area where bupivacaine was injected.  Nausea, vomiting, constipation  Rarely, numbness and tingling in your mouth or lips, lightheadedness, or anxiety may occur.  Call your doctor right away if you think you may be experiencing any of these sensations, or if you have other questions regarding possible side effects.  Follow all other discharge instructions given to you by your surgeon or nurse. Eat a healthy diet and drink plenty of water or other fluids.  If you return to the hospital for any reason within 96 hours following the administration of EXPAREL, please inform your health care providers.

## 2017-11-15 NOTE — Telephone Encounter (Signed)
[facility] and spoke to Dr. Renato Gails, Dermatology Fellow about obtaining depth of invasion for accession number [id number].  She said she would look at the slides and call us back.

## 2017-11-15 NOTE — Op Note (Signed)
PATIENT: Jeanette Mora DATE: 11/15/17   Preop Diagnosis: SCC of vulva, (carcinoma with unknown depth of invasion)  Postoperative Diagnosis: same  Surgery: Partial simple right posterior vulvectomy  Surgeons:  Donaciano Eva, MD Assistant: none  Anesthesia: General   Estimated blood loss: <10 ml  IVF:  233ml   Urine output: 75 ml   Complications: None   Pathology: right posterior vulva with marking stitch at 12 o'clock anterior  Operative findings: ulcerated area measuring 0.5cm on right posterior buttock which appears consistent with healing from biopsy site. No visible residual carcinoma.  Procedure: The patient was identified in the preoperative holding area. Informed consent was signed on the chart. Patient was seen history was reviewed and exam was performed.   The patient was then taken to the operating room and placed in the supine position with SCD hose on. General anesthesia was then induced without difficulty. She was then placed in the dorsolithotomy position. The perineum was prepped with Betadine. The vagina was prepped with Betadine. The patient was then draped after the prep was dried. A Foley catheter was inserted into the bladder under sterile conditions then removed.  Timeout was performed the patient, procedure, antibiotic, allergy, and length of procedure. 5% acetic acid solution was applied to the perineum. The vulvar tissues were inspected for areas of acetowhite changes or leukoplakia. The lesion was identified and the marking pen was used to circumscribe the area with appropriate surgical margins. The subcuticular tissues were infiltrated with 1% lidocaine and exparil. The 15 blade scalpel was used to make an incision through the skin circumferentially as marked. The skin elipse was grasped and was separated from the underlying deep dermal tissues with the bovie device. After the specimen had been completely resected, it was oriented and marked at  12 o'clock with a 0-vicryl suture. The bovie was used to obtain hemostasis at the surgical bed. The subcutaneous tissues were irrigated and made hemostatic.   The deep dermal layer was approximated with 3-0 vicryl mattress sutures to bring the skin edges into approximation and off tension. The wound was closed following langher's lines. The cutaneous layer was closed with interrupted 4-0 vicryl stitches and mattress sutures to ensure a tension free and hemostatic closure. The perineum was again irrigated. The foley was removed.  All instrument, suture, laparotomy, Ray-Tec, and needle counts were correct x2. The patient tolerated the procedure well and was taken recovery room in stable condition. This is Jeanette Mora dictating an operative note on [place].  Thereasa Solo, MD

## 2017-11-15 NOTE — Transfer of Care (Signed)
Immediate Anesthesia Transfer of Care Note  Patient: Jeanette Mora  Procedure(s) Performed: Procedure(s) (LRB): VULVECTOMY PARTIAL (N/A)  Patient Location: PACU  Anesthesia Type: General  Level of Consciousness: awake, oriented, sedated and patient cooperative  Airway & Oxygen Therapy: Patient Spontanous Breathing and Patient connected to face mask oxygen  Post-op Assessment: Report given to PACU RN and Post -op Vital signs reviewed and stable  Post vital signs: Reviewed and stable  Complications: No apparent anesthesia complications Last Vitals:  Vitals Value Taken Time  BP 122/74 11/15/2017 10:05 AM  Temp 36.5 C 11/15/2017 10:05 AM  Pulse 74 11/15/2017 10:08 AM  Resp 11 11/15/2017 10:08 AM  SpO2 100 % 11/15/2017 10:08 AM  Vitals shown include unvalidated device data.  Last Pain:  Vitals:   11/15/17 1005  TempSrc:   PainSc: Asleep

## 2017-11-16 ENCOUNTER — Encounter (HOSPITAL_BASED_OUTPATIENT_CLINIC_OR_DEPARTMENT_OTHER): Payer: Self-pay | Admitting: Gynecologic Oncology

## 2017-11-16 NOTE — Anesthesia Postprocedure Evaluation (Signed)
Anesthesia Post Note  Patient: Jeanette Mora  Procedure(s) Performed: VULVECTOMY PARTIAL (N/A Vulva)     Patient location during evaluation: PACU Anesthesia Type: General Level of consciousness: awake and alert Pain management: pain level controlled Vital Signs Assessment: post-procedure vital signs reviewed and stable Respiratory status: spontaneous breathing, nonlabored ventilation, respiratory function stable and patient connected to nasal cannula oxygen Cardiovascular status: blood pressure returned to baseline and stable Postop Assessment: no apparent nausea or vomiting Anesthetic complications: no    Last Vitals:  Vitals:   11/15/17 1100 11/15/17 1315  BP: (!) 127/56 (!) 173/77  Pulse: 66 (!) 57  Resp: 12 14  Temp:  36.5 C  SpO2: 95% 93%    Last Pain:  Vitals:   11/15/17 1300  TempSrc:   PainSc: 0-No pain                 Barnet Glasgow

## 2017-11-17 ENCOUNTER — Telehealth: Payer: Self-pay | Admitting: Oncology

## 2017-11-17 ENCOUNTER — Telehealth: Payer: Self-pay | Admitting: Gynecologic Oncology

## 2017-11-17 NOTE — Telephone Encounter (Signed)
Patient informed of final path.  Doing well s/p vulvectomy.  Stating she has some constipation which she started senna-kot for yestereday.  Advised to call the office if her symptoms persist.  No concerns voiced.  Advised that we have requested [facility] to add dimensions with depth of invasion to her pathology report from the office biopsy she had confirming cancer.  Advised we would contact her once that report had been received with additional recommendations if any.  Advised to call for any needs or concerns.

## 2017-11-17 NOTE — Telephone Encounter (Addendum)
Dr. Lequita Asal from [facility] left a message saying that she has prepared an addendum for the pathology from 10/26/17.  She said she is waiting for a signature from her attending who is out of the country until Monday.  She will be able to release the addendum once it is signed.  Her direct number is [phone].

## 2017-11-24 ENCOUNTER — Telehealth: Payer: Self-pay | Admitting: Gynecologic Oncology

## 2017-11-24 DIAGNOSIS — C519 Malignant neoplasm of vulva, unspecified: Secondary | ICD-10-CM

## 2017-11-24 NOTE — Telephone Encounter (Signed)
Discussed with patient that we were able to get an addendum to the path report on her biopsy read at [facility] her that it states depth of invasion at least 2.5 mm.  Based on this, plan is to obtain a PET scan prior to her appt with Dr. Denman George in the office for a post-operative check.  No concerns voiced.  Advised she would be hearing from our office with an appointment for her PET scan.  Advised to call for any needs or concerns.

## 2017-11-30 ENCOUNTER — Ambulatory Visit (HOSPITAL_COMMUNITY): Admission: RE | Admit: 2017-11-30 | Payer: Medicare Other | Source: Ambulatory Visit

## 2017-12-01 ENCOUNTER — Encounter (HOSPITAL_COMMUNITY)
Admission: RE | Admit: 2017-12-01 | Discharge: 2017-12-01 | Disposition: A | Discharge status: Home / Self Care | Payer: Medicare Other | Source: Ambulatory Visit | Attending: Gynecologic Oncology | Admitting: Gynecologic Oncology

## 2017-12-01 DIAGNOSIS — C519 Malignant neoplasm of vulva, unspecified: Secondary | ICD-10-CM | POA: Insufficient documentation

## 2017-12-01 LAB — GLUCOSE, CAPILLARY: GLUCOSE-CAPILLARY: 103 mg/dL — AB (ref 70–99)

## 2017-12-01 MED ORDER — FLUDEOXYGLUCOSE F - 18 (FDG) INJECTION: 6.3000 | Freq: Once | INTRAVENOUS | Status: DC | PRN

## 2017-12-02 ENCOUNTER — Encounter (HOSPITAL_COMMUNITY): Payer: Medicare Other

## 2017-12-06 ENCOUNTER — Telehealth: Payer: Self-pay | Admitting: Gynecologic Oncology

## 2017-12-06 NOTE — Telephone Encounter (Signed)
Returned call to patient's daughter.  She was with her mother and wanted to know PET scan results.  Discussed results along with the fact that it would be discussed at her office visit tomorrow as well.  No concerns voiced.  Advised to call for any needs.

## 2017-12-07 ENCOUNTER — Inpatient Hospital Stay: Payer: Medicare Other | Attending: Gynecologic Oncology | Admitting: Gynecologic Oncology

## 2017-12-07 ENCOUNTER — Encounter: Payer: Self-pay | Admitting: Gynecologic Oncology

## 2017-12-07 ENCOUNTER — Telehealth: Payer: Self-pay | Admitting: Oncology

## 2017-12-07 VITALS — BP 131/80 | HR 69 | Temp 97.5°F | Resp 20 | Ht 62.5 in | Wt 128.6 lb

## 2017-12-07 DIAGNOSIS — G8918 Other acute postprocedural pain: Secondary | ICD-10-CM

## 2017-12-07 DIAGNOSIS — K5900 Constipation, unspecified: Secondary | ICD-10-CM

## 2017-12-07 DIAGNOSIS — C519 Malignant neoplasm of vulva, unspecified: Secondary | ICD-10-CM | POA: Insufficient documentation

## 2017-12-07 DIAGNOSIS — Z7189 Other specified counseling: Secondary | ICD-10-CM

## 2017-12-07 MED ORDER — TRAMADOL HCL 50 MG PO TABS: 50.0000 mg | ORAL_TABLET | Freq: Four times a day (QID) | ORAL | 0 refills | Status: DC | PRN

## 2017-12-07 MED ORDER — POLYETHYLENE GLYCOL 3350 17 G PO PACK: 17.0000 g | PACK | Freq: Two times a day (BID) | ORAL | 1 refills | Status: DC

## 2017-12-07 NOTE — Progress Notes (Signed)
Follow-up Note: Gyn-Onc   Jeanette Mora 67 y.o. female  Chief Complaint  Patient presents with  . 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 S/p wide local excision of primary site which established no residual disease after biopsy. Biopsy revealed a depth of invasion of at least 2.93mm (on repeat reading of pathology from original biopsy). PET negative for measurable nodal or distant metastatic disease.   Plan:  Recommend pathologic nodal assessment. Given the very small size of the original lesion (<65mm) and no residual disease on repeat excision (widely negative margins), I am not recommending a radical resection of the primary tumor site. Discussed options of bilateral complete lymphadenectomy vs SLN biopy. I explained that clinical trials have shown SLN biopsy to have comparable accuracy in diagnosis metastatic disease, but is associated with decreased morbidity. Therefore, I am recommending SLN biopsy. I explained that we do not offer that here in [place] due to the lack of access to nuclear medicine injection. However, my partner, Dr Margaretmary Bayley, at [facility] is able to offer this procedure at [facility]. We will facilitate the referral and transfer of records to [facility]  I will see her back for postop care and ongoing surveillance.  I discussed that if nodes are positive for metastatic disease she would qualify for adjuvant radiation, however, if negative, close observation is appropriate.   HPI: 67 year old white female comes accompanied by her adult daughter and is seen in consultation at the request of Dr. Kathryne Eriksson and Tiana Loft regarding management of a newly diagnosed basaloid squamous cell carcinoma of the vulva.  Patient presented to her primary care physician having had a lesion present for several months.  A biopsy of the lesion revealed a basaloid squamous cell carcinoma.  Depth of invasion  could not be determined on the original pathology read.  Therefore, in order to establish extent of invasion, the patient was taken to the OR at [facility] on 11/15/17 for a wide local excision of the vulva. A 3x1.5cm eliptical lesion was removed from the posterior introitus extending to the right buttock encompassing the visible ulcerated site of the prior biopsy with 1cm margins around the apparent site of primary tumor. A radical resection was not performed. Final pathology showed ulceration of the vulvar mucosa with no dysplasia or carcinoma seen.  A repeat evaluation of the original biopsy was requested and it was determined to be a 0.8cm shave biopsy with tumor extending to the deep margin of the biopsy specimen, a depth of at least 2.2mm.  Given this depth of invasion she was staged at least IB and a PET/CT was performed on 627/19 and was negative for metastatic disease. It showed activity along the right inferior perineum (site of surgery), accentuated activity along the right lingual tonsil (visualized by NP Joylene John and felt to be grossly normal), aortic atherosclerosis, emphysema, subtlle accentuated activity in distal esophagus.   Review of Systems:10 point review of systems is negative except as noted in interval history.   Vitals: Blood pressure 131/80, pulse 69, temperature (!) 97.5 F (36.4 C), temperature source Oral, resp. rate 20, height 5' 2.5" (1.588 m), weight 128 lb 9.6 oz (58.3 kg), SpO2 96 %.  Physical Exam: General : The patient is a healthy woman in no acute distress.  HEENT: normocephalic, extraoccular movements normal; neck is supple without thyromegally  Lynphnodes: Supraclavicular and inguinal nodes not enlarged  Abdomen: Soft,  non-tender, no ascites, no organomegally, no masses, no hernias  Pelvic:  EGBUS: The incision on the vaginal introitus extending to right buttock is healing normally. Slight wound separation at introitus. Otherwise in tact.  Appropriate induration. No cellulitis or drainage.     Lower extremities: No edema or varicosities. Normal range of motion      Allergies  Allergen Reactions  . Oxycodone Other (See Comments)    Percocet "anxiety and Increased heart rate"  . Sulfamethoxazole     unknown  . Tagamet [Cimetidine] Hives  . Codeine Palpitations    "jittery, rapid heart beat"    Past Medical History:  Diagnosis Date  . Anxiety   . Dyslipidemia   . Family history of adverse reaction to anesthesia    daughter- ponv  . Full dentures   . History of basal cell carcinoma (BCC) excision    bcc lesion from at left eye;  and  face  . History of epidermal inclusion cyst excision    nose  . Hypertension   . Pre-diabetes   . Rotator cuff arthropathy of right shoulder   . Vulvar cancer [facility]) gyn oncologist-  dr Denman George    Past Surgical History:  Procedure Laterality Date  . CARPAL TUNNEL RELEASE Bilateral 2001 approx.  . TYMPANOPLASTY Right 2004 aprox.   "ear drum patched"  . VULVECTOMY PARTIAL N/A 11/15/2017   Procedure: VULVECTOMY PARTIAL;  Surgeon: Everitt Amber, MD;  Location: [facility];  Service: Gynecology;  Laterality: N/A;    Current Outpatient Medications  Medication Sig Dispense Refill  . ALPRAZolam (XANAX) 1 MG tablet Take 1 mg by mouth 3 (three) times daily as needed for anxiety.     Marland Kitchen aspirin EC 81 MG tablet Take 81 mg by mouth daily.    . calcium carbonate (OS-CAL) 600 MG TABS Take 600 mg by mouth every morning.     Marland Kitchen gemfibrozil (LOPID) 600 MG tablet Take 600 mg by mouth 2 (two) times daily.      Marland Kitchen ibuprofen (ADVIL,MOTRIN) 600 MG tablet TAKE 1 TABLET BY MOUTH EVERY 6 HOURS AS NEEDED 385 tablet 0  . Magnesium 250 MG TABS Take 1 tablet by mouth 2 (two) times daily.     . Melatonin 1 MG TABS Take 1-3 tablets by mouth at bedtime as needed.    . Omega-3 Fatty Acids (FISH OIL) 1200 MG CAPS Take 1 capsule by mouth daily.      . rosuvastatin (CRESTOR) 20 MG tablet Take 20 mg by  mouth every morning.     . senna (SENOKOT) 8.6 MG TABS tablet Take 1 tablet (8.6 mg total) by mouth at bedtime. 120 each 0  . traMADol (ULTRAM) 50 MG tablet Take 1 tablet (50 mg total) by mouth every 6 (six) hours as needed. 30 tablet 0  . valsartan-hydrochlorothiazide (DIOVAN-HCT) 320-25 MG per tablet Take 1 tablet by mouth every morning.      No current facility-administered medications for this visit.     Social History   Socioeconomic History  . Marital status: Divorced    Spouse name: Not on file  . Number of children: Not on file  . Years of education: Not on file  . Highest education level: Not on file  Occupational History  . Not on file  Social Needs  . Financial resource strain: Not on file  . Food insecurity:    Worry: Not on file    Inability: Not on file  . Transportation needs:  Medical: Not on file    Non-medical: Not on file  Tobacco Use  . Smoking status: Current Every Day Smoker    Packs/day: 1.00    Years: 40.00    Pack years: 40.00    Types: Cigarettes  . Smokeless tobacco: Never Used  Substance and Sexual Activity  . Alcohol use: No  . Drug use: No  . Sexual activity: Not on file  Lifestyle  . Physical activity:    Days per week: Not on file    Minutes per session: Not on file  . Stress: Not on file  Relationships  . Social connections:    Talks on phone: Not on file    Gets together: Not on file    Attends religious service: Not on file    Active member of club or organization: Not on file    Attends meetings of clubs or organizations: Not on file    Relationship status: Not on file  . Intimate partner violence:    Fear of current or ex partner: Not on file    Emotionally abused: Not on file    Physically abused: Not on file    Forced sexual activity: Not on file  Other Topics Concern  . Not on file  Social History Narrative  . Not on file    Family History  Problem Relation Age of Onset  . Leukemia Brother   . Breast cancer  Maternal Aunt   . Diabetes Daughter   . Hypertension Daughter   . Cervical cancer Daughter   . Diabetes Daughter   . Brain cancer Other      20 minutes of direct face to face counseling time was spent with the patient. This included discussion about prognosis, therapy recommendations and postoperative side effects and are beyond the scope of routine postoperative care.   Thereasa Solo, MD 12/07/2017, 11:09 AM

## 2017-12-07 NOTE — Telephone Encounter (Signed)
Left a message for [facility] - Dr. Micael Hampshire regarding patient referral and new address for pathology slides.

## 2017-12-07 NOTE — Patient Instructions (Signed)
1) We will set you up to meet with Dr. Micael Hampshire at [facility] to discuss sentinel lymph node dissection.  2) We have sent in a refill on Tramadol to your pharmacy along with Miralax that you should take twice daily until you start going regularly then you can take once a day.  3) Plan to follow up with Dr. Denman George after your procedure.  Please call for any questions or concerns.

## 2017-12-09 ENCOUNTER — Telehealth: Payer: Self-pay | Admitting: *Deleted

## 2017-12-09 NOTE — Telephone Encounter (Signed)
Spoke with Judeen Hammans at [facility] she has put a hold on an appt with Dr. Micael Hampshire for the patient. I have faxed the records to her at [phone]. Patient has an appt on hold for July 16th at 11:15am, patient to arrive at 10:45am. Judeen Hammans said the path slides need to be sent to the following address. Message routed to [facility]YN [facility]  [place], [facility] [address]  [place], [place]  [address]

## 2017-12-12 ENCOUNTER — Telehealth: Payer: Self-pay | Admitting: Oncology

## 2017-12-12 NOTE — Telephone Encounter (Signed)
Left a message for [facility] to have slides sent from 10/27/17 biopsy to [employer].

## 2017-12-13 ENCOUNTER — Telehealth: Payer: Self-pay | Admitting: Oncology

## 2017-12-13 NOTE — Telephone Encounter (Signed)
Sherry from [facility] called and said she did not receive records for patient.  Faxed records to [phone].  Also let her know that [facility] will be sending slides for pathology.  Judeen Hammans said she would notify patient of appointment on 12/20/17 at 11:15.

## 2018-02-28 ENCOUNTER — Telehealth: Payer: Self-pay | Admitting: *Deleted

## 2018-05-23 ENCOUNTER — Other Ambulatory Visit (HOSPITAL_COMMUNITY): Payer: Self-pay | Admitting: Family Medicine

## 2018-05-23 DIAGNOSIS — Z1231 Encounter for screening mammogram for malignant neoplasm of breast: Secondary | ICD-10-CM

## 2018-06-12 ENCOUNTER — Ambulatory Visit (HOSPITAL_COMMUNITY): Payer: Medicare Other

## 2018-06-15 ENCOUNTER — Ambulatory Visit (HOSPITAL_COMMUNITY)
Admission: RE | Admit: 2018-06-15 | Discharge: 2018-06-15 | Disposition: A | Discharge status: Home / Self Care | Payer: Medicare Other | Source: Ambulatory Visit | Attending: Family Medicine | Admitting: Family Medicine

## 2018-06-15 DIAGNOSIS — Z1231 Encounter for screening mammogram for malignant neoplasm of breast: Secondary | ICD-10-CM | POA: Insufficient documentation

## 2018-06-16 ENCOUNTER — Other Ambulatory Visit (HOSPITAL_COMMUNITY): Payer: Self-pay | Admitting: Family Medicine

## 2018-06-16 DIAGNOSIS — R928 Other abnormal and inconclusive findings on diagnostic imaging of breast: Secondary | ICD-10-CM

## 2018-07-04 ENCOUNTER — Ambulatory Visit (HOSPITAL_COMMUNITY)
Admission: RE | Admit: 2018-07-04 | Discharge: 2018-07-04 | Disposition: A | Discharge status: Home / Self Care | Payer: Medicare Other | Source: Ambulatory Visit | Attending: Family Medicine | Admitting: Family Medicine

## 2018-07-04 ENCOUNTER — Ambulatory Visit (HOSPITAL_COMMUNITY): Payer: Medicare Other

## 2018-07-04 DIAGNOSIS — R928 Other abnormal and inconclusive findings on diagnostic imaging of breast: Secondary | ICD-10-CM | POA: Insufficient documentation

## 2019-06-11 ENCOUNTER — Other Ambulatory Visit (HOSPITAL_COMMUNITY): Payer: Self-pay | Admitting: Physician Assistant

## 2019-06-11 DIAGNOSIS — Z1231 Encounter for screening mammogram for malignant neoplasm of breast: Secondary | ICD-10-CM

## 2019-07-09 ENCOUNTER — Other Ambulatory Visit: Payer: Self-pay

## 2019-07-09 ENCOUNTER — Ambulatory Visit (HOSPITAL_COMMUNITY)
Admission: RE | Admit: 2019-07-09 | Discharge: 2019-07-09 | Disposition: A | Discharge status: Home / Self Care | Payer: Medicare Other | Source: Ambulatory Visit | Attending: Physician Assistant | Admitting: Physician Assistant

## 2019-07-09 DIAGNOSIS — Z1231 Encounter for screening mammogram for malignant neoplasm of breast: Secondary | ICD-10-CM | POA: Diagnosis not present

## 2020-06-09 ENCOUNTER — Other Ambulatory Visit (HOSPITAL_COMMUNITY): Payer: Self-pay | Admitting: Physician Assistant

## 2020-06-09 DIAGNOSIS — Z1231 Encounter for screening mammogram for malignant neoplasm of breast: Secondary | ICD-10-CM

## 2020-06-27 ENCOUNTER — Ambulatory Visit (HOSPITAL_COMMUNITY): Payer: Medicare Other

## 2020-07-09 ENCOUNTER — Other Ambulatory Visit: Payer: Self-pay

## 2020-07-09 ENCOUNTER — Ambulatory Visit (HOSPITAL_COMMUNITY)
Admission: RE | Admit: 2020-07-09 | Discharge: 2020-07-09 | Disposition: A | Discharge status: Home / Self Care | Payer: Medicare Other | Source: Ambulatory Visit | Attending: Physician Assistant | Admitting: Physician Assistant

## 2020-07-09 DIAGNOSIS — Z1231 Encounter for screening mammogram for malignant neoplasm of breast: Secondary | ICD-10-CM | POA: Insufficient documentation

## 2021-06-05 ENCOUNTER — Other Ambulatory Visit (HOSPITAL_COMMUNITY): Payer: Self-pay | Admitting: Physician Assistant

## 2021-06-05 DIAGNOSIS — Z1231 Encounter for screening mammogram for malignant neoplasm of breast: Secondary | ICD-10-CM

## 2021-06-10 ENCOUNTER — Other Ambulatory Visit: Payer: Self-pay | Admitting: Physician Assistant

## 2021-06-10 ENCOUNTER — Other Ambulatory Visit (HOSPITAL_COMMUNITY): Payer: Self-pay | Admitting: Physician Assistant

## 2021-06-10 DIAGNOSIS — Z122 Encounter for screening for malignant neoplasm of respiratory organs: Secondary | ICD-10-CM

## 2021-06-10 DIAGNOSIS — F17200 Nicotine dependence, unspecified, uncomplicated: Secondary | ICD-10-CM

## 2021-06-10 DIAGNOSIS — E2839 Other primary ovarian failure: Secondary | ICD-10-CM

## 2021-06-19 ENCOUNTER — Other Ambulatory Visit: Payer: Self-pay | Admitting: Orthopaedic Surgery

## 2021-06-19 ENCOUNTER — Ambulatory Visit
Admission: RE | Admit: 2021-06-19 | Discharge: 2021-06-19 | Disposition: A | Discharge status: Home / Self Care | Payer: Medicare Other | Source: Ambulatory Visit | Attending: Orthopaedic Surgery | Admitting: Orthopaedic Surgery

## 2021-06-19 ENCOUNTER — Encounter (HOSPITAL_COMMUNITY): Payer: Self-pay | Admitting: Orthopaedic Surgery

## 2021-06-19 ENCOUNTER — Other Ambulatory Visit: Payer: Self-pay

## 2021-06-19 DIAGNOSIS — M25572 Pain in left ankle and joints of left foot: Secondary | ICD-10-CM

## 2021-06-19 NOTE — Progress Notes (Signed)
Surgery orders requested via Epic inbox. °

## 2021-06-19 NOTE — Anesthesia Preprocedure Evaluation (Addendum)
Anesthesia Evaluation  Patient identified by MRN, date of birth, ID band Patient awake    Reviewed: Allergy & Precautions, NPO status , Patient's Chart, lab work & pertinent test results  Airway Mallampati: III  TM Distance: >3 FB Neck ROM: Full    Dental  (+) Edentulous Upper, Edentulous Lower   Pulmonary Current Smoker and Patient abstained from smoking.,  40 pack year history- states only smoking a few cigg/d    Pulmonary exam normal breath sounds clear to auscultation       Cardiovascular hypertension (111/63 in preop), Pt. on medications Normal cardiovascular exam Rhythm:Regular Rate:Normal     Neuro/Psych PSYCHIATRIC DISORDERS Anxiety Depression negative neurological ROS     GI/Hepatic negative GI ROS, Neg liver ROS,   Endo/Other  diabetes (pre-diabetic)  Renal/GU negative Renal ROS  Female GU complaint (hx vulvar ca s/p resection)     Musculoskeletal L ankle fx   Abdominal   Peds  Hematology negative hematology ROS (+)   Anesthesia Other Findings Very sleep yin preop- took hydrocodone prior to coming in   Reproductive/Obstetrics negative OB ROS                           Anesthesia Physical Anesthesia Plan  ASA: 3  Anesthesia Plan: General and Regional   Post-op Pain Management: Regional block   Induction: Intravenous  PONV Risk Score and Plan: Ondansetron, Dexamethasone and Treatment may vary due to age or medical condition  Airway Management Planned: LMA  Additional Equipment: None  Intra-op Plan:   Post-operative Plan: Extubation in OR  Informed Consent: I have reviewed the patients History and Physical, chart, labs and discussed the procedure including the risks, benefits and alternatives for the proposed anesthesia with the patient or authorized representative who has indicated his/her understanding and acceptance.     Dental advisory given  Plan Discussed  with: CRNA  Anesthesia Plan Comments:       Anesthesia Quick Evaluation

## 2021-06-19 NOTE — Progress Notes (Addendum)
COVID swab appointment: N/A  COVID Vaccine Completed:  Yes x2 Date COVID Vaccine completed: Has received booster:  Yes x3 COVID vaccine manufacturer:   Moderna     Date of COVID positive in last 90 days:  No  PCP - Clyde Lundborg, PA-C Cardiologist - N/a  Chest x-ray - N/A EKG - N/A Stress Test -  N/A ECHO -  N/A Cardiac Cath -  N/A Pacemaker/ICD device last checked: Spinal Cord Stimulator:  Sleep Study -  N/A CPAP -    Prediabetes Fasting Blood Sugar - Checks Blood Sugar - does not check   Blood Thinner Instructions: Aspirin Instructions:  ASA 81.  Patient states that physician advised her to stay on it. Last Dose:  Activity level:  Can go up a flight of stairs and perform activities of daily living without stopping and without symptoms of chest pain or shortness of breath.  Limitations now due to ankle fracture.    Anesthesia review:  N/A  Patient denies shortness of breath, fever, cough and chest pain at PAT appointment  Patient verbalized understanding of instructions that were given to them at the PAT appointment. Patient was also instructed that they will need to review over the PAT instructions again at home before surgery.

## 2021-06-20 ENCOUNTER — Ambulatory Visit (HOSPITAL_COMMUNITY): Payer: Medicare Other

## 2021-06-20 ENCOUNTER — Ambulatory Visit (HOSPITAL_COMMUNITY)
Admission: RE | Admit: 2021-06-20 | Discharge: 2021-06-20 | Disposition: A | Discharge status: Home / Self Care | Payer: Medicare Other | Attending: Orthopaedic Surgery | Admitting: Orthopaedic Surgery

## 2021-06-20 ENCOUNTER — Ambulatory Visit (HOSPITAL_COMMUNITY): Payer: Medicare Other | Admitting: Anesthesiology

## 2021-06-20 ENCOUNTER — Encounter (HOSPITAL_COMMUNITY): Payer: Self-pay | Admitting: Orthopaedic Surgery

## 2021-06-20 ENCOUNTER — Encounter (HOSPITAL_COMMUNITY)
Admission: RE | Disposition: A | Discharge status: Home / Self Care | Payer: Self-pay | Source: Home / Self Care | Attending: Orthopaedic Surgery

## 2021-06-20 DIAGNOSIS — I10 Essential (primary) hypertension: Secondary | ICD-10-CM | POA: Insufficient documentation

## 2021-06-20 DIAGNOSIS — Z419 Encounter for procedure for purposes other than remedying health state, unspecified: Secondary | ICD-10-CM

## 2021-06-20 DIAGNOSIS — F17210 Nicotine dependence, cigarettes, uncomplicated: Secondary | ICD-10-CM | POA: Insufficient documentation

## 2021-06-20 DIAGNOSIS — Y92009 Unspecified place in unspecified non-institutional (private) residence as the place of occurrence of the external cause: Secondary | ICD-10-CM | POA: Diagnosis not present

## 2021-06-20 DIAGNOSIS — Z9889 Other specified postprocedural states: Secondary | ICD-10-CM

## 2021-06-20 DIAGNOSIS — W19XXXA Unspecified fall, initial encounter: Secondary | ICD-10-CM | POA: Insufficient documentation

## 2021-06-20 DIAGNOSIS — Z8589 Personal history of malignant neoplasm of other organs and systems: Secondary | ICD-10-CM | POA: Diagnosis not present

## 2021-06-20 DIAGNOSIS — S82852A Displaced trimalleolar fracture of left lower leg, initial encounter for closed fracture: Secondary | ICD-10-CM | POA: Diagnosis present

## 2021-06-20 DIAGNOSIS — R7303 Prediabetes: Secondary | ICD-10-CM | POA: Insufficient documentation

## 2021-06-20 DIAGNOSIS — S93432A Sprain of tibiofibular ligament of left ankle, initial encounter: Secondary | ICD-10-CM | POA: Insufficient documentation

## 2021-06-20 HISTORY — PX: ORIF ANKLE FRACTURE: SHX5408

## 2021-06-20 HISTORY — DX: Depression, unspecified: F32.A

## 2021-06-20 SURGERY — OPEN REDUCTION INTERNAL FIXATION (ORIF) ANKLE FRACTURE
Anesthesia: Regional | Site: Ankle | Laterality: Left

## 2021-06-20 MED ORDER — HYDROCODONE-ACETAMINOPHEN 7.5-325 MG PO TABS: 1.0000 | ORAL_TABLET | Freq: Once | ORAL | Status: DC | PRN

## 2021-06-20 MED ORDER — DEXAMETHASONE SODIUM PHOSPHATE 10 MG/ML IJ SOLN
INTRAMUSCULAR | Status: DC | PRN
  Administered 2021-06-20: 10 mg via INTRAVENOUS

## 2021-06-20 MED ORDER — PROMETHAZINE HCL 25 MG/ML IJ SOLN: 6.2500 mg | INTRAMUSCULAR | Status: DC | PRN

## 2021-06-20 MED ORDER — SODIUM CHLORIDE 0.9 % IR SOLN
Status: DC | PRN
  Administered 2021-06-20: 1000 mL

## 2021-06-20 MED ORDER — PROPOFOL 10 MG/ML IV BOLUS
INTRAVENOUS | Status: AC
  Filled 2021-06-20: qty 20

## 2021-06-20 MED ORDER — PHENYLEPHRINE HCL (PRESSORS) 10 MG/ML IV SOLN
INTRAVENOUS | Status: AC
  Filled 2021-06-20: qty 1

## 2021-06-20 MED ORDER — AMISULPRIDE (ANTIEMETIC) 5 MG/2ML IV SOLN: 10.0000 mg | Freq: Once | INTRAVENOUS | Status: DC | PRN

## 2021-06-20 MED ORDER — ISOPROPYL ALCOHOL 70 % SOLN
Status: AC
  Filled 2021-06-20: qty 480

## 2021-06-20 MED ORDER — CEFAZOLIN SODIUM-DEXTROSE 2-4 GM/100ML-% IV SOLN
2.0000 g | INTRAVENOUS | Status: AC
  Administered 2021-06-20: 2 mg via INTRAVENOUS
  Filled 2021-06-20: qty 100

## 2021-06-20 MED ORDER — VANCOMYCIN HCL 1000 MG IV SOLR
INTRAVENOUS | Status: AC
  Filled 2021-06-20: qty 20

## 2021-06-20 MED ORDER — CHLORHEXIDINE GLUCONATE 0.12 % MT SOLN
15.0000 mL | Freq: Once | OROMUCOSAL | Status: AC
  Administered 2021-06-20: 09:00:00 15 mL via OROMUCOSAL

## 2021-06-20 MED ORDER — FENTANYL CITRATE PF 50 MCG/ML IJ SOSY: 25.0000 ug | PREFILLED_SYRINGE | INTRAMUSCULAR | Status: DC | PRN

## 2021-06-20 MED ORDER — PHENYLEPHRINE HCL-NACL 20-0.9 MG/250ML-% IV SOLN
INTRAVENOUS | Status: DC | PRN
  Administered 2021-06-20: 50 ug/min via INTRAVENOUS

## 2021-06-20 MED ORDER — FENTANYL CITRATE PF 50 MCG/ML IJ SOSY
50.0000 ug | PREFILLED_SYRINGE | INTRAMUSCULAR | Status: DC
  Filled 2021-06-20: qty 2

## 2021-06-20 MED ORDER — FENTANYL CITRATE (PF) 100 MCG/2ML IJ SOLN
INTRAMUSCULAR | Status: AC
  Filled 2021-06-20: qty 2

## 2021-06-20 MED ORDER — PROPOFOL 10 MG/ML IV BOLUS
INTRAVENOUS | Status: DC | PRN
  Administered 2021-06-20: 70 mg via INTRAVENOUS
  Administered 2021-06-20: 50 mg via INTRAVENOUS

## 2021-06-20 MED ORDER — PHENYLEPHRINE 40 MCG/ML (10ML) SYRINGE FOR IV PUSH (FOR BLOOD PRESSURE SUPPORT)
PREFILLED_SYRINGE | INTRAVENOUS | Status: DC | PRN
  Administered 2021-06-20 (×3): 80 ug via INTRAVENOUS

## 2021-06-20 MED ORDER — VANCOMYCIN HCL 1000 MG IV SOLR
INTRAVENOUS | Status: DC | PRN
  Administered 2021-06-20: 1000 mg via TOPICAL

## 2021-06-20 MED ORDER — POVIDONE-IODINE 10 % EX SWAB
2.0000 "application " | Freq: Once | CUTANEOUS | Status: AC
  Administered 2021-06-20: 2 via TOPICAL

## 2021-06-20 MED ORDER — ONDANSETRON HCL 4 MG/2ML IJ SOLN
INTRAMUSCULAR | Status: AC
  Filled 2021-06-20: qty 2

## 2021-06-20 MED ORDER — DEXAMETHASONE SODIUM PHOSPHATE 10 MG/ML IJ SOLN
INTRAMUSCULAR | Status: DC | PRN
Start: 2021-06-20 — End: 2021-06-20
  Administered 2021-06-20: 10 mg

## 2021-06-20 MED ORDER — ROPIVACAINE HCL 5 MG/ML IJ SOLN
INTRAMUSCULAR | Status: DC | PRN
  Administered 2021-06-20: 40 mL via PERINEURAL

## 2021-06-20 MED ORDER — MIDAZOLAM HCL 2 MG/2ML IJ SOLN
1.0000 mg | INTRAMUSCULAR | Status: DC
  Filled 2021-06-20: qty 2

## 2021-06-20 MED ORDER — DEXAMETHASONE SODIUM PHOSPHATE 10 MG/ML IJ SOLN
INTRAMUSCULAR | Status: AC
  Filled 2021-06-20: qty 1

## 2021-06-20 MED ORDER — ACETAMINOPHEN 10 MG/ML IV SOLN
INTRAVENOUS | Status: DC | PRN
  Administered 2021-06-20: 1000 mg via INTRAVENOUS

## 2021-06-20 MED ORDER — ACETAMINOPHEN 10 MG/ML IV SOLN
INTRAVENOUS | Status: AC
  Filled 2021-06-20: qty 100

## 2021-06-20 MED ORDER — MIDAZOLAM HCL 2 MG/2ML IJ SOLN
INTRAMUSCULAR | Status: AC
  Filled 2021-06-20: qty 2

## 2021-06-20 MED ORDER — PHENYLEPHRINE HCL (PRESSORS) 10 MG/ML IV SOLN
INTRAVENOUS | Status: DC | PRN
Start: 2021-06-20 — End: 2021-06-20
  Administered 2021-06-20 (×2): 80 ug via INTRAVENOUS

## 2021-06-20 MED ORDER — LIDOCAINE 2% (20 MG/ML) 5 ML SYRINGE
INTRAMUSCULAR | Status: DC | PRN
  Administered 2021-06-20: 20 mg via INTRAVENOUS

## 2021-06-20 MED ORDER — ACETAMINOPHEN 500 MG PO TABS
1000.0000 mg | ORAL_TABLET | Freq: Once | ORAL | Status: DC
  Filled 2021-06-20: qty 2

## 2021-06-20 MED ORDER — ONDANSETRON HCL 4 MG/2ML IJ SOLN
INTRAMUSCULAR | Status: DC | PRN
  Administered 2021-06-20: 4 mg via INTRAVENOUS

## 2021-06-20 MED ORDER — LACTATED RINGERS IV SOLN: INTRAVENOUS | Status: DC

## 2021-06-20 MED ORDER — ORAL CARE MOUTH RINSE: 15.0000 mL | Freq: Once | OROMUCOSAL | Status: AC

## 2021-06-20 MED ORDER — CHLORHEXIDINE GLUCONATE 4 % EX LIQD: 60.0000 mL | Freq: Once | CUTANEOUS | Status: DC

## 2021-06-20 SURGICAL SUPPLY — 67 items
APL PRP STRL LF DISP 70% ISPRP (MISCELLANEOUS) ×4
BAG SPEC THK2 15X12 ZIP CLS (MISCELLANEOUS) ×2
BAG ZIPLOCK 12X15 (MISCELLANEOUS) ×3 IMPLANT
BIT DRILL 2.4X140 LONG SOLID (BIT) ×2 IMPLANT
BIT DRILL 2.5X2.75 QC CALB (BIT) ×2 IMPLANT
BIT DRILL 3.5X5.5 QC CALB (BIT) ×2 IMPLANT
BIT DRILL SOLID 2.0 X 110MM (DRILL) ×1 IMPLANT
BNDG ELASTIC 4X5.8 VLCR STR LF (GAUZE/BANDAGES/DRESSINGS) ×3 IMPLANT
BNDG ELASTIC 6X5.8 VLCR STR LF (GAUZE/BANDAGES/DRESSINGS) ×3 IMPLANT
BNDG GAUZE ELAST 4 BULKY (GAUZE/BANDAGES/DRESSINGS) ×3 IMPLANT
CHLORAPREP W/TINT 26 (MISCELLANEOUS) ×5 IMPLANT
COVER SURGICAL LIGHT HANDLE (MISCELLANEOUS) ×3 IMPLANT
CUFF TOURN SGL QUICK 34 (TOURNIQUET CUFF) ×3
CUFF TRNQT CYL 34X4.125X (TOURNIQUET CUFF) ×2 IMPLANT
DEVICE FIXATION SYNDESMOSIS (Bone Implant) ×2 IMPLANT
DRAPE C-ARM 42X120 X-RAY (DRAPES) ×2 IMPLANT
DRAPE C-ARMOR (DRAPES) ×2 IMPLANT
DRAPE EXTREMITY T 121X128X90 (DISPOSABLE) ×3 IMPLANT
DRAPE OEC MINIVIEW 54X84 (DRAPES) ×3 IMPLANT
DRAPE U-SHAPE 47X51 STRL (DRAPES) ×3 IMPLANT
DRILL SOLID 2.0 X 110MM (DRILL) ×3
DRSG PAD ABDOMINAL 8X10 ST (GAUZE/BANDAGES/DRESSINGS) ×15 IMPLANT
ELECT REM PT RETURN 15FT ADLT (MISCELLANEOUS) ×3 IMPLANT
FIXATION ZIPTIGHT ANKLE SNDSMS (Ankle) ×1 IMPLANT
GAUZE SPONGE 4X4 12PLY STRL (GAUZE/BANDAGES/DRESSINGS) ×4 IMPLANT
GAUZE XEROFORM 1X8 LF (GAUZE/BANDAGES/DRESSINGS) ×2 IMPLANT
GLOVE SURG ENC TEXT LTX SZ7 (GLOVE) ×2 IMPLANT
GLOVE SURG LTX SZ7 (GLOVE) ×2 IMPLANT
GLOVE SURG MICRO LTX SZ7.5 (GLOVE) ×3 IMPLANT
GLOVE SURG UNDER LTX SZ7.5 (GLOVE) ×3 IMPLANT
GOWN STRL REUS W/ TWL XL LVL3 (GOWN DISPOSABLE) ×1 IMPLANT
GOWN STRL REUS W/TWL LRG LVL3 (GOWN DISPOSABLE) ×3 IMPLANT
GOWN STRL REUS W/TWL XL LVL3 (GOWN DISPOSABLE) ×3
K-WIRE ACE 1.6X6 (WIRE) ×6
KIT BASIN OR (CUSTOM PROCEDURE TRAY) ×3 IMPLANT
KIT TURNOVER KIT A (KITS) ×2 IMPLANT
KWIRE ACE 1.6X6 (WIRE) ×2 IMPLANT
PACK TOTAL JOINT (CUSTOM PROCEDURE TRAY) ×3 IMPLANT
PAD CAST 4YDX4 CTTN HI CHSV (CAST SUPPLIES) ×9 IMPLANT
PADDING CAST COTTON 4X4 STRL (CAST SUPPLIES) ×15
PADDING CAST COTTON 6X4 STRL (CAST SUPPLIES) ×3 IMPLANT
PLATE LOCK 4H 109 LT DIST FIB (Plate) ×2 IMPLANT
PLATE MEDIAL MALLEOLUS 4H HOOK (Plate) ×2 IMPLANT
PLATE POST FX GORILLA 3H (Plate) IMPLANT
SCREW CORTICAL 3.5MM 26MM (Screw) ×2 IMPLANT
SCREW LOCK CORT STAR 3.5X10 (Screw) ×2 IMPLANT
SCREW LOCK CORT STAR 3.5X12 (Screw) ×6 IMPLANT
SCREW LOCK CORT STAR 3.5X14 (Screw) ×6 IMPLANT
SCREW LOCK PLATE R3 2.7X8 (Screw) ×2 IMPLANT
SCREW LOCK PLATE R3 3.5X24 (Screw) ×2 IMPLANT
SCREW NLCK 28X3.5XNS R3CON NON (Screw) ×1 IMPLANT
SCREW NON LOCKING LP 3.5 14MM (Screw) ×2 IMPLANT
SCREW NONLOCK 3.5X28 (Screw) ×3 IMPLANT
SCREW NONLOCKING 3.5X24 (Screw) ×2 IMPLANT
SPLINT PLASTER CAST XFAST 5X30 (CAST SUPPLIES) ×1 IMPLANT
SPLINT PLASTER XFAST SET 5X30 (CAST SUPPLIES) ×1
SPONGE T-LAP 18X18 ~~LOC~~+RFID (SPONGE) ×3 IMPLANT
STOCKINETTE 4X48 STRL (DRAPES) ×3 IMPLANT
STOCKINETTE TUBULAR SYNTH 4IN (CAST SUPPLIES) ×2 IMPLANT
SUCTION FRAZIER HANDLE 10FR (MISCELLANEOUS) ×3
SUCTION TUBE FRAZIER 10FR DISP (MISCELLANEOUS) ×2 IMPLANT
SUT ETHILON 3 0 PS 1 (SUTURE) ×10 IMPLANT
SUT VIC AB 2-0 SH 27 (SUTURE) ×3
SUT VIC AB 2-0 SH 27XBRD (SUTURE) ×2 IMPLANT
SUT VIC AB 3-0 SH 27 (SUTURE) ×15
SUT VIC AB 3-0 SH 27X BRD (SUTURE) ×7 IMPLANT
ZIPTIGHT ANKLE SYNODESMOSS FIX (Ankle) ×3 IMPLANT

## 2021-06-20 NOTE — Op Note (Addendum)
06/20/2021  1:44 PM   PATIENT: Jeanette Mora  71 y.o. female  MRN: [id number]   PRE-OPERATIVE DIAGNOSIS:   Left trimalleolar ankle fracture dislocation   POST-OPERATIVE DIAGNOSIS:   Left trimalleolar ankle fracture dislocation   PROCEDURE: Left distal fibula open reduction internal fixation Left medial malleolus open reduction internal fixation Left open syndesmosis reduction and internal fixation (ZipTight device x 2) Left open reduction of the posterior malleolus without internal fixation   SURGEON:  Armond Hang, MD   ASSISTANT: None   ANESTHESIA: General, regional   EBL: Minimal   TOURNIQUET:   938 minutes   COMPLICATIONS: None apparent   DISPOSITION: Extubated, awake and stable to recovery.   INDICATION FOR PROCEDURE: There was an injury on 06/15/21 when the patient sustained left ankle fracture dislocation after falling at home. She underwent closed reduction in ED. The patient has pain localized to the aforementioned area. Aggravating factors include prolonged weightbearing and activity. Relieving factors include rest and activity modification. The patient has thus far attempted splint immobilization. The patient does not have relevant surgical history. She lives in [place]. She is a current daily smoker 1 pack per day.  We discussed the diagnosis, alternative treatment options, risks and benefits of the above surgical intervention, as well as alternative non-operative treatments. All questions/concerns were addressed and the patient/family demonstrated appropriate understanding of the diagnosis, the procedure, the postoperative course, and overall prognosis. The patient wished to proceed with surgical intervention and signed an informed surgical consent as such, in each others presence prior to surgery.   PROCEDURE IN DETAIL: After preoperative consent was obtained and the correct operative site was identified, the patient was brought to the  operating room supine on stretcher and transferred onto operating table. General anesthesia was induced. Preoperative antibiotics were administered. Surgical timeout was taken. The patient was then positioned supine with an ipsilateral hip bump. The operative lower extremity was prepped and draped in standard sterile fashion with a tourniquet around the thigh. The extremity was exsanguinated and the tourniquet was inflated to 275 mmHg.  Penny sized medial and lateral areas of serous and hemorrhagic healing blistering were noted without surrounding erythema. Otherwise, there was only mild swelling with skin wrinkling present throughout. All incisions were planned to avoid areas of blistering. Due to healthy condition of soft tissues, it was decided to proceed with open reduction and definitive fixation today rather than external fixation.   A standard lateral incision was made over the distal fibula. Dissection was carried down to the level of the fibula and the fracture site identified. The superficial peroneal nerve was identified and protected throughout the procedure. The fibula was noted to be shortened with interposed periosteum. The fibula was brought out to length and then the posterior malleolus was accessed through this fracture. This posterior malleolus was thoroughly irrigated to help with reduction. The fibula fracture was debrided and the edges defined to achieve cortical read. Reduction maneuver was performed using pointed reduction forceps and lobster forceps. In this manner, the fibula length was restored and fracture reduced. A lag screw was placed to secure the reduction and left in for added stability of overall fixation. Due to poor bone quality and extensive comminution at the fracture site, it was decided to use an anatomic locking distal fibula plate. We then selected a Zimmer anatomic locking plate to match the anatomy of the distal fibula and placed it laterally. This was implanted under  intraoperative fluoroscopy with a combination of distal locking screws and  proximal cortical & locking screws.   We then made a direct medial ankle approach and extended this proximally in anticipation of implanting a hook plate. Dissection was carried down to the level of the medial malleolar fragment. A dental pick and freer elevator were used to reduce the medial malleolar fragment. Of note, there was extensive comminution of this fragment into multiple segments, all of which had very poor bone quality. A Paragon28 medial distal tibia hook plate was utilized to fix the reduced medial malleolar fragment and the tines were carefully inserted into the distal tip of the malleolus. The plate was applied posterosuperior to anterioinferior to best capture the major fragments of the medial comminution. The other fragments were too small/comminuted to undergo screw fixation. We placed a non-locking screw in the hook plate in compression mode to further obtain compression across the fracture. We implanted locking screws proximally to further fixate the plate.  A manual external rotation stress radiograph was obtained and demonstrated slight widening of the ankle mortise. Given this intraoperative finding as well as her severe recent dislocation, it was decided to reduce and fix the syndesmosis. Therefore a suture fixation system (ZipTight device) was implanted through the fibula plate in cannulated fashion to fix the syndesmosis. This sequence was repeated for a second ZipTight device. Anchor/button position was verified along anteromedial tibial cortex by both fluoroscopy and direct visualization through the medial incision. A repeat stress radiograph showed complete stability of the ankle mortise to testing.  The surgical sites were thoroughly irrigated. The tourniquet was deflated and hemostasis achieved. Excellent capillary refill and palpable pulses noted in the operative foot. Vancomycin powder was applied to  the surgical sites. The deep layers were closed using 2-0 vicryl and the subcutaneous tissues closed using 3-0 vicryl. The skin was closed without tension using 3-0 nylon suture.    The leg was cleaned with saline and sterile xeroform dressings with gauze were applied to incisions and the small areas of blistering. A well padded bulky short leg splint was applied. The patient was awakened from anesthesia and transported to the recovery room in stable condition.    FOLLOW UP PLAN: -transfer to PACU, then home -strict NWB operative extremity, maximum elevation -maintain short leg splint until follow up -Norco rx given (pt cannot tolerate oxycodone) -smoking cessation counseling -VTE Ppx: Xarelto 10 mg once daily x 3 wk and then Aspirin 325 mg twice daily x 3 wk -follow up in 1 week for wound check and dressing/splint change -sutures out in 3 weeks with exchange of short leg splint to short leg cast in outpatient office   RADIOGRAPHS: AP, lateral, oblique and stress radiographs of the left ankle were obtained intraoperatively. These showed interval reduction and fixation of the fractures. Manual stress radiographs were taken and the ankle mortise and tibiofibular relationship were noted to be stable following fixation. All hardware is appropriately positioned and of the appropriate lengths. No other acute injuries are noted.   Armond Hang Orthopaedic Surgery [facility]

## 2021-06-20 NOTE — H&P (Signed)
H&P Update: ? ?-History and Physical Reviewed ? ?-Patient has been re-examined ? ?-No change in the plan of care ? ?-The risks and benefits were presented and reviewed. The risks due to hardware failure/irritation, new/persistent infection, stiffness, nerve/vessel/tendon injury, nonunion/malunion, wound healing issues, development of arthritis, failure of this surgery, possibility of external fixation with delayed definitive surgery, need for further surgery, thromboembolic events, anesthesia/medical complications, amputation, death among others were discussed. The patient acknowledged the explanation, agreed to proceed with the plan and a consent was signed. ? ?Jeanette Mora ? ?

## 2021-06-20 NOTE — Anesthesia Procedure Notes (Signed)
Procedure Name: LMA Insertion Date/Time: 06/20/2021 9:07 AM Performed by: Lind Covert, CRNA Pre-anesthesia Checklist: Patient identified, Emergency Drugs available, Suction available and Patient being monitored Patient Re-evaluated:Patient Re-evaluated prior to induction Oxygen Delivery Method: Circle system utilized Preoxygenation: Pre-oxygenation with 100% oxygen Induction Type: IV induction Ventilation: Mask ventilation without difficulty LMA: LMA inserted LMA Size: 4.0 Tube type: Oral Number of attempts: 1 Placement Confirmation: positive ETCO2 and breath sounds checked- equal and bilateral Tube secured with: Tape Dental Injury: Teeth and Oropharynx as per pre-operative assessment

## 2021-06-20 NOTE — Progress Notes (Signed)
Assisted Dr. Doroteo Glassman with left Popliteal and left  Adductor Canal block. Side rails up, monitors on throughout procedure. See vital signs in flow sheet. Tolerated Procedure well. No sedation given, patient still very sleepy from her own medication this am.

## 2021-06-20 NOTE — Anesthesia Procedure Notes (Addendum)
Anesthesia Regional Block: Popliteal block   Pre-Anesthetic Checklist: , timeout performed,  Correct Patient, Correct Site, Correct Laterality,  Correct Procedure, Correct Position, site marked,  Risks and benefits discussed,  Surgical consent,  Pre-op evaluation,  At surgeon's request and post-op pain management  Laterality: Left  Prep: Maximum Sterile Barrier Precautions used, chloraprep       Needles:  Injection technique: Single-shot  Needle Type: Echogenic Stimulator Needle     Needle Length: 9cm  Needle Gauge: 22     Additional Needles:   Procedures:,,,, ultrasound used (permanent image in chart),,    Narrative:  Start time: 06/20/2021 8:35 AM End time: 06/20/2021 8:40 AM Injection made incrementally with aspirations every 5 mL.  Performed by: Personally  Anesthesiologist: Pervis Hocking, DO  Additional Notes: Monitors applied. No increased pain on injection. No increased resistance to injection. Injection made in 5cc increments. Good needle visualization. Patient tolerated procedure well.

## 2021-06-20 NOTE — Discharge Instructions (Signed)
Armond Hang, MD [facility]  Please read the following information regarding your care after surgery.  Medications  You only need a prescription for the narcotic pain medicine (ex. oxycodone, Percocet, Norco).  All of the other medicines listed below are available over the counter. ? Aleve 2 pills twice a day for the first 3 days after surgery. ? acetominophen (Tylenol) 650 mg every 4-6 hours as you need for minor to moderate pain ? oxycodone as prescribed for severe pain  ? To help prevent blood clots, take xarelto 10 mg once daily for 3 weeks after surgery. Then start taking aspirin (325 mg) twice daily for another 3 weeks after surgery.  You should also get up every hour while you are awake to move around.    Weight Bearing ? IMPORTANT: Do NOT bear any weight on the operated leg or foot.  Cast / Splint / Dressing ? If you have a splint, do NOT remove this. Keep your splint, cast or dressing clean and dry.  Dont put anything (coat hanger, pencil, etc) down inside of it.  If it gets wet, call the office immediately to schedule an appointment for a cast change.  Swelling IMPORTANT: It is normal for you to have swelling where you had surgery. To reduce swelling and pain, keep at least 3 pillows under your leg so that your toes are above your nose and your heel is above the level of your hip.  It may be necessary to keep your foot or leg elevated for several weeks.  This is critical to helping your incisions heal and your pain to feel better.  Follow Up Call my office at [phone] when you are discharged from the hospital or surgery center to schedule an appointment to be seen 2-3 weeks after surgery.  Call my office at [phone] if you develop a fever >101.5 F, nausea, vomiting, bleeding from the surgical site or severe pain.

## 2021-06-20 NOTE — Anesthesia Procedure Notes (Signed)
Anesthesia Regional Block: Adductor canal block   Pre-Anesthetic Checklist: , timeout performed,  Correct Patient, Correct Site, Correct Laterality,  Correct Procedure, Correct Position, site marked,  Risks and benefits discussed,  Surgical consent,  Pre-op evaluation,  At surgeon's request and post-op pain management  Laterality: Left  Prep: Maximum Sterile Barrier Precautions used, chloraprep       Needles:  Injection technique: Single-shot  Needle Type: Echogenic Stimulator Needle     Needle Length: 9cm  Needle Gauge: 22     Additional Needles:   Procedures:,,,, ultrasound used (permanent image in chart),,    Narrative:  Start time: 06/20/2021 8:40 AM End time: 06/20/2021 8:45 AM Injection made incrementally with aspirations every 5 mL.  Performed by: Personally  Anesthesiologist: Pervis Hocking, DO  Additional Notes: Monitors applied. No increased pain on injection. No increased resistance to injection. Injection made in 5cc increments. Good needle visualization. Patient tolerated procedure well.

## 2021-06-20 NOTE — Transfer of Care (Signed)
Immediate Anesthesia Transfer of Care Note  Patient: Jeanette Mora  Procedure(s) Performed: OPEN REDUCTION INTERNAL FIXATION (ORIF) ANKLE FRACTURE (Left: Ankle)  Patient Location: PACU  Anesthesia Type:GA combined with regional for post-op pain  Level of Consciousness: drowsy  Airway & Oxygen Therapy: Patient Spontanous Breathing and Patient connected to face mask oxygen  Post-op Assessment: Report given to RN and Post -op Vital signs reviewed and stable  Post vital signs: Reviewed and stable  Last Vitals:  Vitals Value Taken Time  BP 114/70 06/20/21 1219  Temp    Pulse 72 06/20/21 1221  Resp 12 06/20/21 1221  SpO2 98 % 06/20/21 1221  Vitals shown include unvalidated device data.  Last Pain:  Vitals:   06/20/21 0757  TempSrc: Oral  PainSc: 6          Complications: No notable events documented.

## 2021-06-20 NOTE — Anesthesia Postprocedure Evaluation (Signed)
Anesthesia Post Note  Patient: Jeanette Mora  Procedure(s) Performed: OPEN REDUCTION INTERNAL FIXATION (ORIF) ANKLE FRACTURE (Left: Ankle)     Patient location during evaluation: PACU Anesthesia Type: Regional and General Level of consciousness: awake and alert, oriented and patient cooperative Pain management: pain level controlled Vital Signs Assessment: post-procedure vital signs reviewed and stable Respiratory status: spontaneous breathing, nonlabored ventilation and respiratory function stable Cardiovascular status: blood pressure returned to baseline and stable Postop Assessment: no apparent nausea or vomiting Anesthetic complications: no   No notable events documented.  Last Vitals:  Vitals:   06/20/21 1219 06/20/21 1230  BP: 114/70 (!) 100/54  Pulse: 74 72  Resp: 12 11  Temp: 36.6 C   SpO2: 100% 98%    Last Pain:  Vitals:   06/20/21 1230  TempSrc:   PainSc: 0-No pain   Pain Goal:                   Pervis Hocking

## 2021-06-20 NOTE — Brief Op Note (Signed)
06/20/2021  12:29 PM  PATIENT:  Jeanette Mora  71 y.o. female  PRE-OPERATIVE DIAGNOSIS:  Left trimalleolar ankle fracture dislocation  POST-OPERATIVE DIAGNOSIS:  Left trimalleolar ankle fracture dislocation  PROCEDURE:  Procedure(s) with comments: OPEN REDUCTION INTERNAL FIXATION (ORIF) ANKLE FRACTURE (Left) - 120  SURGEON:  Surgeon(s) and Role:    * Armond Hang, MD - Primary  PHYSICIAN ASSISTANT: None  ASSISTANTS: none   ANESTHESIA:   regional and general  EBL:  10 mL  BLOOD ADMINISTERED:none  DRAINS: none   LOCAL MEDICATIONS USED:  OTHER Vanc powder  SPECIMEN:  No Specimen  DISPOSITION OF SPECIMEN:  N/A  COUNTS:  YES  TOURNIQUET:   Total Tourniquet Time Documented: Thigh (Left) - -[id number] minutes Total: Thigh (Left) - -[id number] minutes   DICTATION: .Note written in EPIC  PLAN OF CARE: Discharge to home after PACU  PATIENT DISPOSITION:  PACU - hemodynamically stable.   Delay start of Pharmacological VTE agent (>24hrs) due to surgical blood loss or risk of bleeding: yes

## 2021-06-22 ENCOUNTER — Other Ambulatory Visit (HOSPITAL_COMMUNITY): Payer: Medicare Other

## 2021-06-23 ENCOUNTER — Encounter (HOSPITAL_COMMUNITY): Payer: Self-pay | Admitting: Orthopaedic Surgery

## 2021-06-27 NOTE — H&P (Signed)
ORTHOPAEDIC SURGERY H&P  Subjective:  There was an injury on 06/15/21 when the patient sustained left ankle fracture dislocation after falling at home. She underwent closed reduction in ED. The patient has pain localized to the aforementioned area. Aggravating factors include prolonged weightbearing and activity. Relieving factors include rest and activity modification. The patient has thus far attempted splint immobilization. The patient does not have relevant surgical history. She lives in [place]. She is a current daily smoker 1 pack per day.   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   MULTIPLE TOOTH EXTRACTIONS     ORIF ANKLE FRACTURE Left 06/20/2021   Procedure: OPEN REDUCTION INTERNAL FIXATION (ORIF) ANKLE FRACTURE;  Surgeon: Armond Hang, MD;  Location: [facility];  Service: Orthopedics;  Laterality: Left;  120   TYMPANOPLASTY Right 2004 aprox.   "ear drum patched"   VULVECTOMY PARTIAL N/A 11/15/2017   Procedure: VULVECTOMY PARTIAL;  Surgeon: Everitt Amber, MD;  Location: [facility];  Service: Gynecology;  Laterality: N/A;     (Not in an outpatient encounter)    Allergies  Allergen Reactions   Oxycodone Other (See Comments)    Percocet "anxiety and Increased heart rate"   Sulfamethoxazole Hives   Tagamet [Cimetidine] Hives   Codeine Palpitations    "jittery, rapid heart beat"    Social History   Socioeconomic History   Marital status: Divorced     Spouse name: Not on file   Number of children: Not on file   Years of education: Not on file   Highest education level: Not on file  Occupational History   Not on file  Tobacco Use   Smoking status: Every Day    Packs/day: 1.00    Years: 40.00    Pack years: 40.00    Types: Cigarettes   Smokeless tobacco: Never  Vaping Use   Vaping Use: Never used  Substance and Sexual Activity   Alcohol use: No   Drug use: No   Sexual activity: Not on file  Other Topics Concern   Not on file  Social History Narrative   Not on file   Social Determinants of Health   Financial Resource Strain: Not on file  Food Insecurity: Not on file  Transportation Needs: Not on file  Physical Activity: Not on file  Stress: Not on file  Social Connections: Not on file  Intimate Partner Violence: Not on file     History reviewed. No pertinent family history.   Review of Systems Pertinent items are noted in HPI.  Objective: Vital signs in last 24 hours: Vitals with BMI 06/20/2021 06/20/2021 06/20/2021  Height - - -  Weight - - -  BMI - - -  Systolic 941 94 96  Diastolic 64 58 60  Pulse 73 76 65      EXAM: General: Well nourished, well developed. Awake, alert and  oriented to time, place, person. Normal mood and affect. No apparent distress. Breathing room air.  Operative Lower Extremity: Alignment - Neutral Deformity - None Skin intact Tenderness to palpation - left ankle Normal range of motion 5/5 TA, PT, GS, Per, EHL, FHL Sensation intact to light touch throughout Palpable DP and PT pulses Special testing: None The contralateral foot/ankle was examined for comparison and noted to be neurovascularly intact with no localized deformity, swelling, or tenderness.  Imaging Review Nonweightbearing x-rays of the left ankle taken were independently reviewed by me demonstrating left ankle fracture dislocation.  Assessment/Plan: The clinical and radiographic findings were reviewed and  discussed at length with the patient.  The patient has left ankle fracture dislocation.  We spoke at length about the natural course of these findings. We discussed nonoperative and operative treatment options in detail.  I have recommended the following: Surgery in the form of left ankle ORIF. The risks and benefits were presented and reviewed. The risks due to hardware failure/irritation, infection, stiffness, nerve/vessel/tendon injury, nonunion/malunion, wound healing issues, failure of this surgery, need for further surgery, thromboembolic events, amputation, death among others were discussed. The patient acknowledged the explanation, agreed to proceed with the plan and a consent was signed.  Armond Hang  Orthopaedic Surgery [facility]

## 2021-07-03 ENCOUNTER — Encounter (HOSPITAL_BASED_OUTPATIENT_CLINIC_OR_DEPARTMENT_OTHER): Payer: Medicare Other | Admitting: Internal Medicine

## 2021-07-13 ENCOUNTER — Inpatient Hospital Stay (HOSPITAL_COMMUNITY): Admission: RE | Admit: 2021-07-13 | Payer: Medicare Other | Source: Ambulatory Visit

## 2021-07-16 ENCOUNTER — Ambulatory Visit (HOSPITAL_COMMUNITY): Admission: RE | Admit: 2021-07-16 | Payer: Medicare Other | Source: Ambulatory Visit

## 2021-07-16 ENCOUNTER — Encounter (HOSPITAL_COMMUNITY): Payer: Self-pay

## 2021-10-05 ENCOUNTER — Other Ambulatory Visit (HOSPITAL_COMMUNITY): Payer: Medicare Other

## 2021-10-05 ENCOUNTER — Ambulatory Visit (HOSPITAL_COMMUNITY): Payer: Medicare Other

## 2021-10-12 ENCOUNTER — Other Ambulatory Visit (HOSPITAL_BASED_OUTPATIENT_CLINIC_OR_DEPARTMENT_OTHER): Payer: Medicare Other

## 2021-10-12 ENCOUNTER — Ambulatory Visit (HOSPITAL_COMMUNITY)
Admission: RE | Admit: 2021-10-12 | Discharge: 2021-10-12 | Disposition: A | Discharge status: Home / Self Care | Payer: Medicare Other | Source: Ambulatory Visit | Attending: Physician Assistant | Admitting: Physician Assistant

## 2021-10-12 DIAGNOSIS — F17200 Nicotine dependence, unspecified, uncomplicated: Secondary | ICD-10-CM | POA: Diagnosis not present

## 2021-10-12 DIAGNOSIS — E2839 Other primary ovarian failure: Secondary | ICD-10-CM | POA: Insufficient documentation

## 2021-10-12 DIAGNOSIS — Z780 Asymptomatic menopausal state: Secondary | ICD-10-CM | POA: Insufficient documentation

## 2021-10-12 DIAGNOSIS — Z13820 Encounter for screening for osteoporosis: Secondary | ICD-10-CM | POA: Insufficient documentation

## 2021-10-12 DIAGNOSIS — M8589 Other specified disorders of bone density and structure, multiple sites: Secondary | ICD-10-CM | POA: Insufficient documentation

## 2021-10-12 DIAGNOSIS — Z1231 Encounter for screening mammogram for malignant neoplasm of breast: Secondary | ICD-10-CM | POA: Diagnosis present

## 2021-10-13 ENCOUNTER — Other Ambulatory Visit (HOSPITAL_COMMUNITY): Payer: Self-pay | Admitting: Physician Assistant

## 2021-10-13 DIAGNOSIS — R928 Other abnormal and inconclusive findings on diagnostic imaging of breast: Secondary | ICD-10-CM

## 2021-11-04 ENCOUNTER — Ambulatory Visit (HOSPITAL_COMMUNITY)
Admission: RE | Admit: 2021-11-04 | Discharge: 2021-11-04 | Disposition: A | Discharge status: Home / Self Care | Payer: Medicare Other | Source: Ambulatory Visit | Attending: Physician Assistant | Admitting: Physician Assistant

## 2021-11-04 DIAGNOSIS — R928 Other abnormal and inconclusive findings on diagnostic imaging of breast: Secondary | ICD-10-CM | POA: Insufficient documentation

## 2021-12-04 ENCOUNTER — Ambulatory Visit
Admission: RE | Admit: 2021-12-04 | Discharge: 2021-12-04 | Disposition: A | Discharge status: Home / Self Care | Payer: Medicare Other | Source: Ambulatory Visit | Attending: Physician Assistant | Admitting: Physician Assistant

## 2021-12-04 DIAGNOSIS — Z122 Encounter for screening for malignant neoplasm of respiratory organs: Secondary | ICD-10-CM

## 2021-12-04 DIAGNOSIS — F17200 Nicotine dependence, unspecified, uncomplicated: Secondary | ICD-10-CM

## 2022-03-15 ENCOUNTER — Other Ambulatory Visit: Payer: Self-pay | Admitting: Oral Surgery

## 2022-08-07 IMAGING — US US BREAST*R* LIMITED INC AXILLA
1 series · 12 of 12 positions shown · non-contrast
Comparison: Previous exam(s).

CLINICAL DATA: Screening recall for a right axillary mass.

EXAM:
DIGITAL DIAGNOSTIC UNILATERAL RIGHT MAMMOGRAM WITH TOMOSYNTHESIS AND
CAD; ULTRASOUND RIGHT BREAST LIMITED
TECHNIQUE: Right digital diagnostic mammography and breast tomosynthesis was
performed. The images were evaluated with computer-aided detection.;
Targeted ultrasound examination of the right breast was performed

[Series 1: us breast*right* limited inc axilla · 0.07mm/px · 12 of 12 slices shown]
[im 1/12]
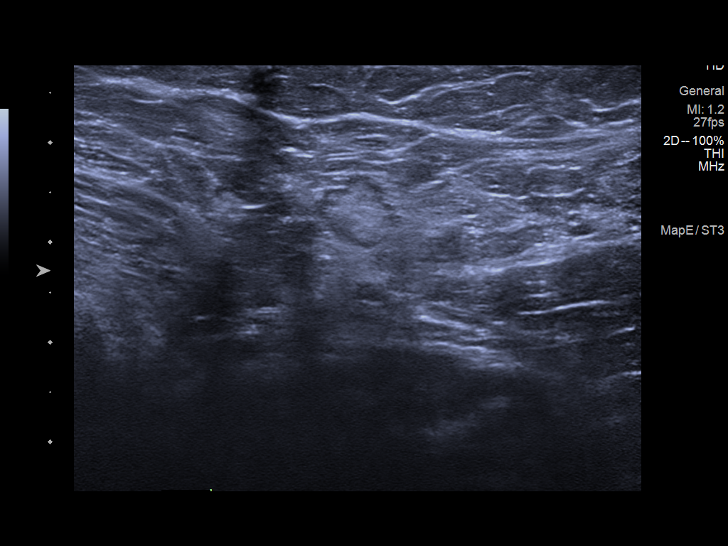
[im 2/12]
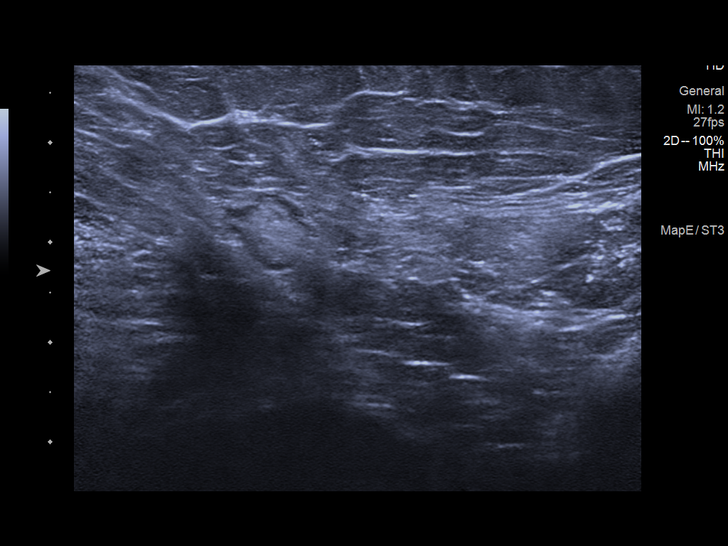
[im 3/12]
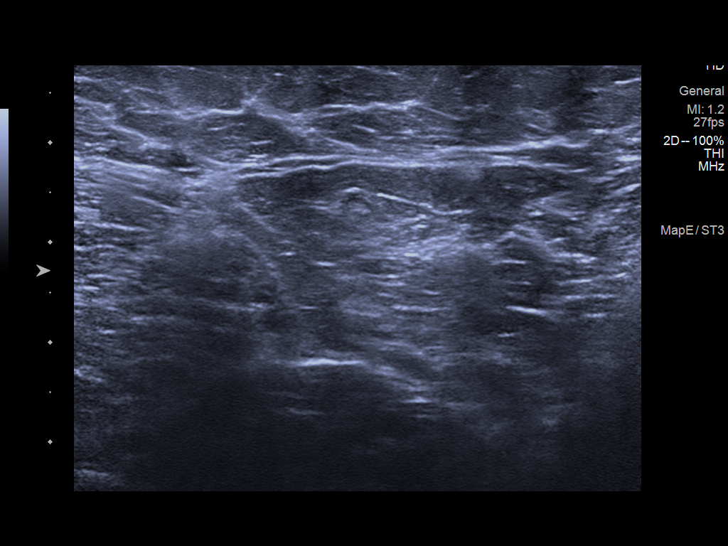
[im 4/12]
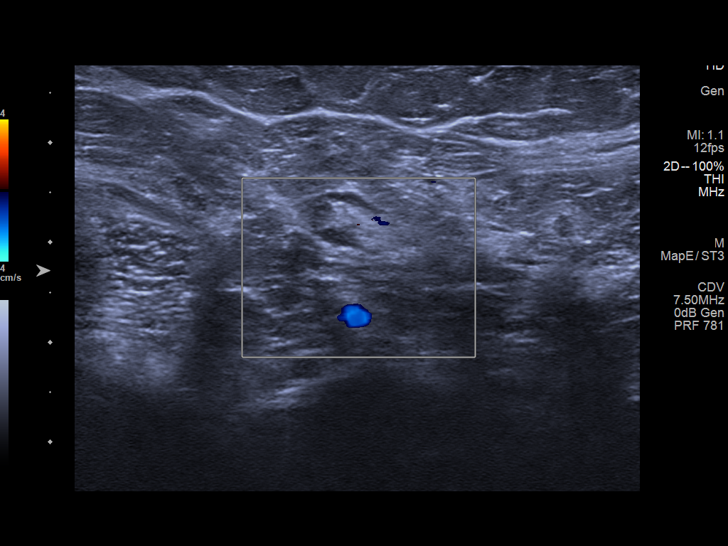
[im 5/12]
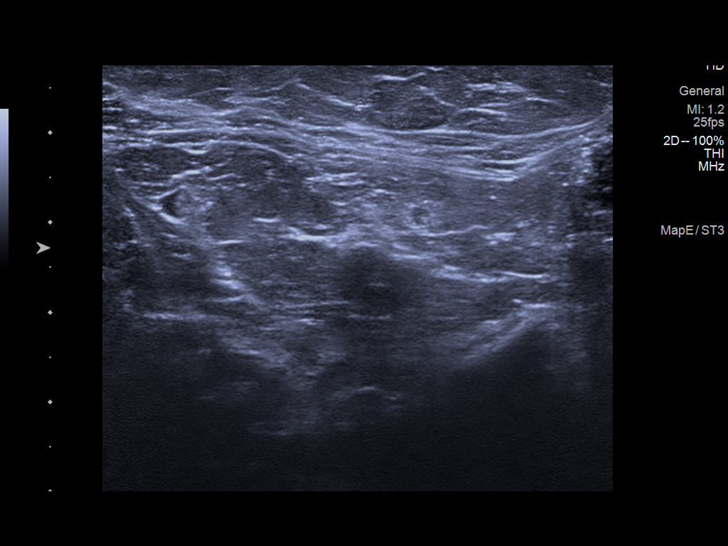
[im 6/12]
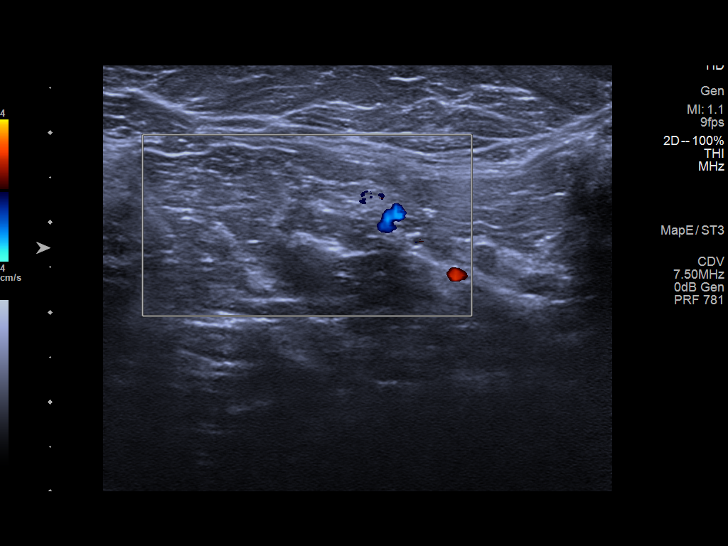
[im 7/12]
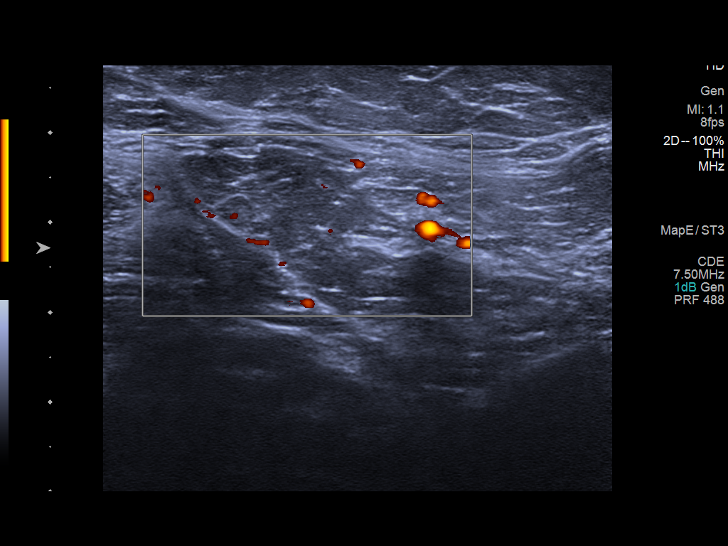
[im 8/12]
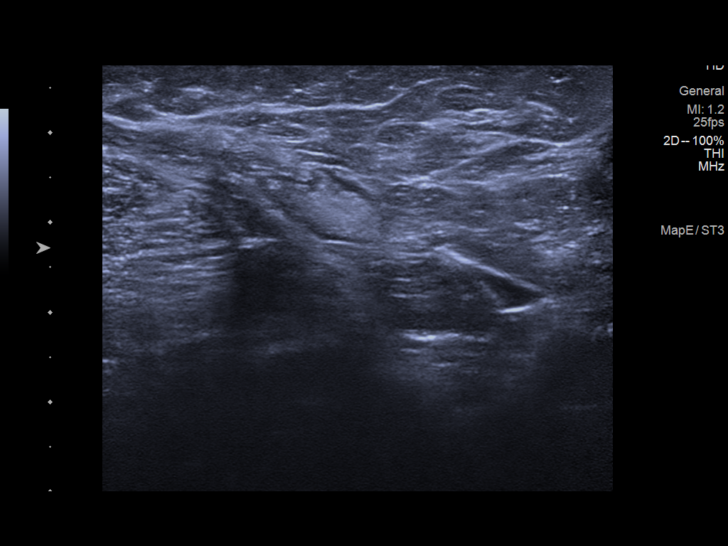
[im 9/12]
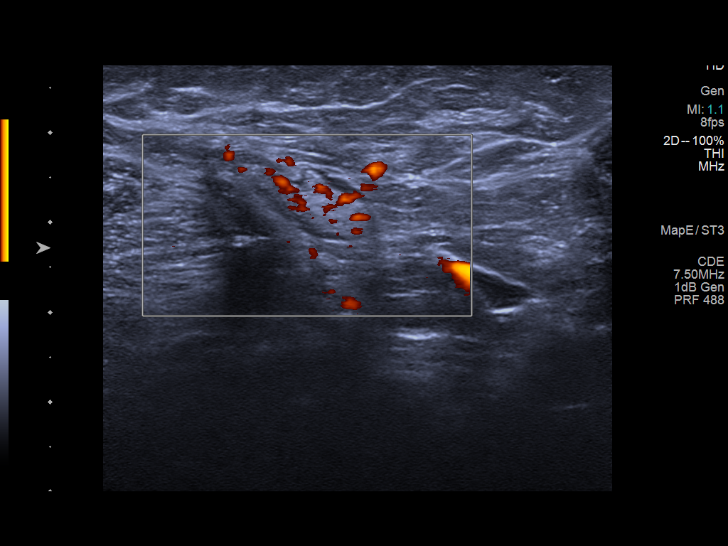
[im 10/12]
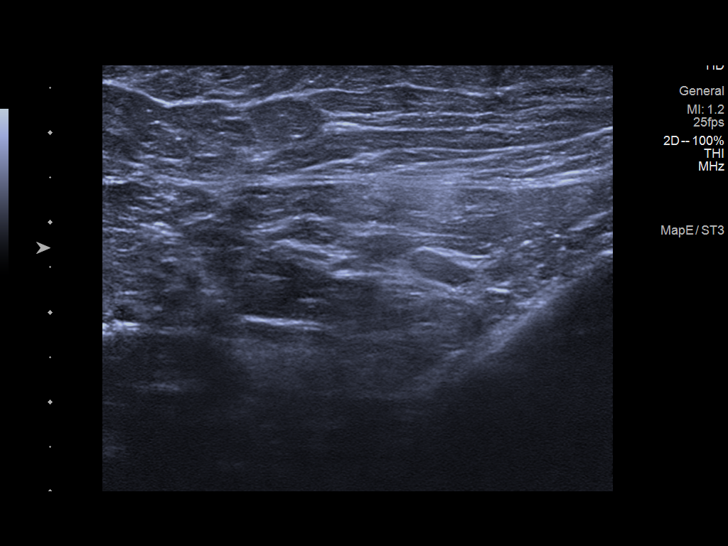
[im 11/12]
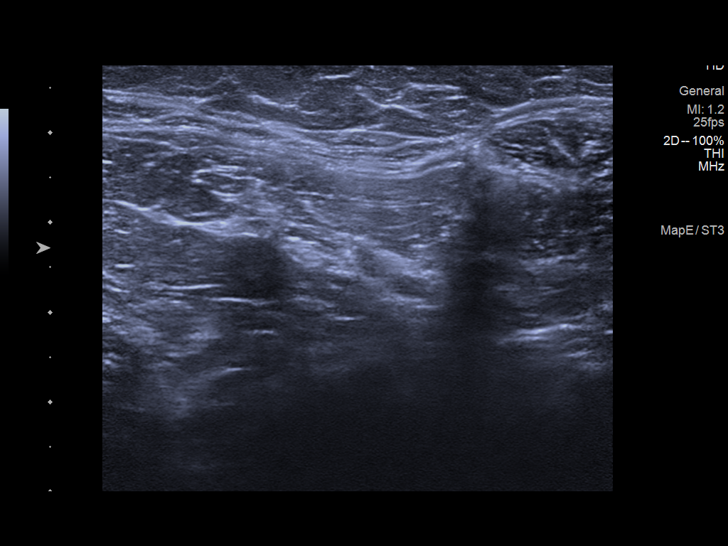
[im 12/12]
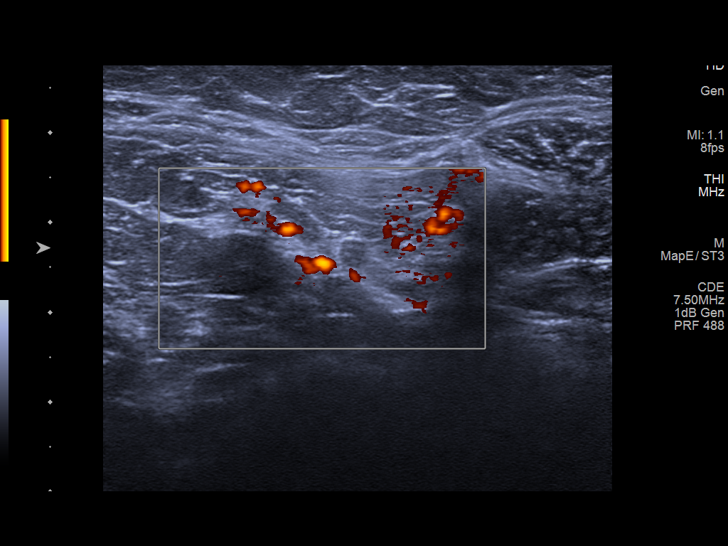

[12 of 12 positions shown; findings below may reference images not displayed]

ACR Breast Density Category b: There are scattered areas of
fibroglandular density.
FINDINGS: Spot compression tomosynthesis images over the right axilla
demonstrates a persistent oval partially visualized mass with a
suggestion of a central lucency. This most likely represents a lymph
node. Ultrasound will be performed for further evaluation.

Ultrasound of the right axilla demonstrates normal-appearing lymph
nodes.
IMPRESSION: Normal right axillary lymph nodes.

RECOMMENDATION:
Screening mammogram in one year.(Code:6X-I-ZAM)

I have discussed the findings and recommendations with the patient.
If applicable, a reminder letter will be sent to the patient
regarding the next appointment.

BI-RADS CATEGORY  1: Negative.

## 2022-08-07 IMAGING — MG MM DIGITAL DIAGNOSTIC UNILAT*R* W/ TOMO W/ CAD
6 series · 6 of 18 positions shown · non-contrast
Comparison: Previous exam(s).

CLINICAL DATA: Screening recall for a right axillary mass.

EXAM:
DIGITAL DIAGNOSTIC UNILATERAL RIGHT MAMMOGRAM WITH TOMOSYNTHESIS AND
CAD; ULTRASOUND RIGHT BREAST LIMITED
TECHNIQUE: Right digital diagnostic mammography and breast tomosynthesis was
performed. The images were evaluated with computer-aided detection.;
Targeted ultrasound examination of the right breast was performed

[R ML synth-2D]
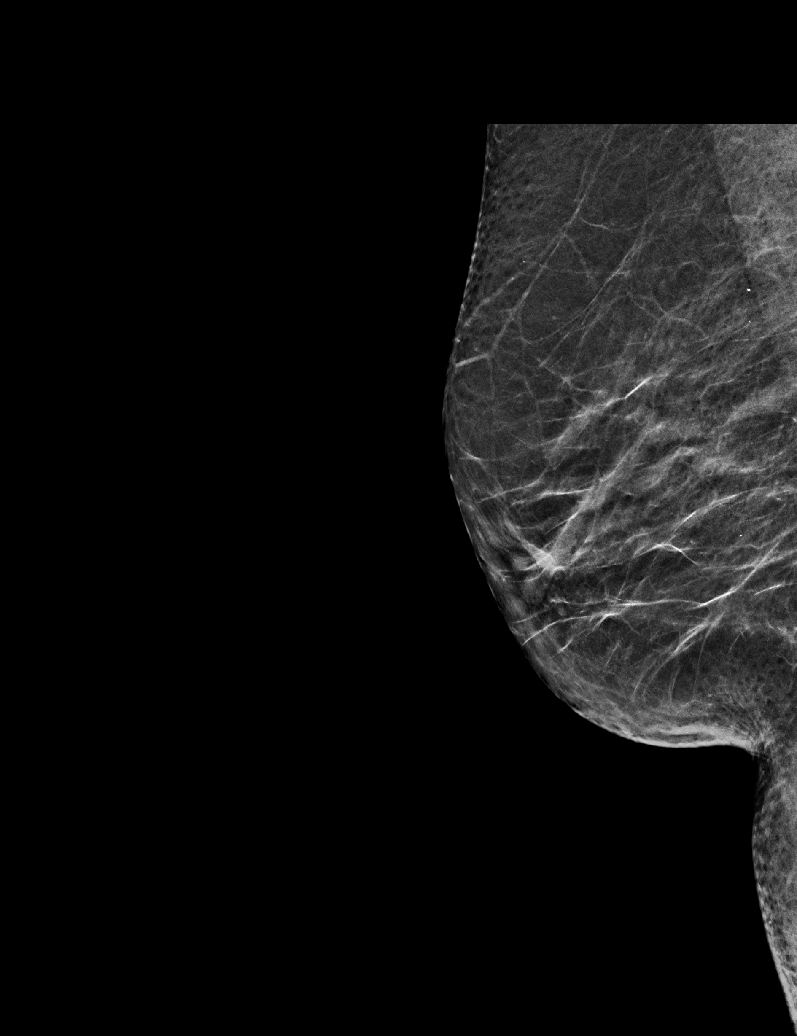

[R MLO synth-2D (1 of 2)]
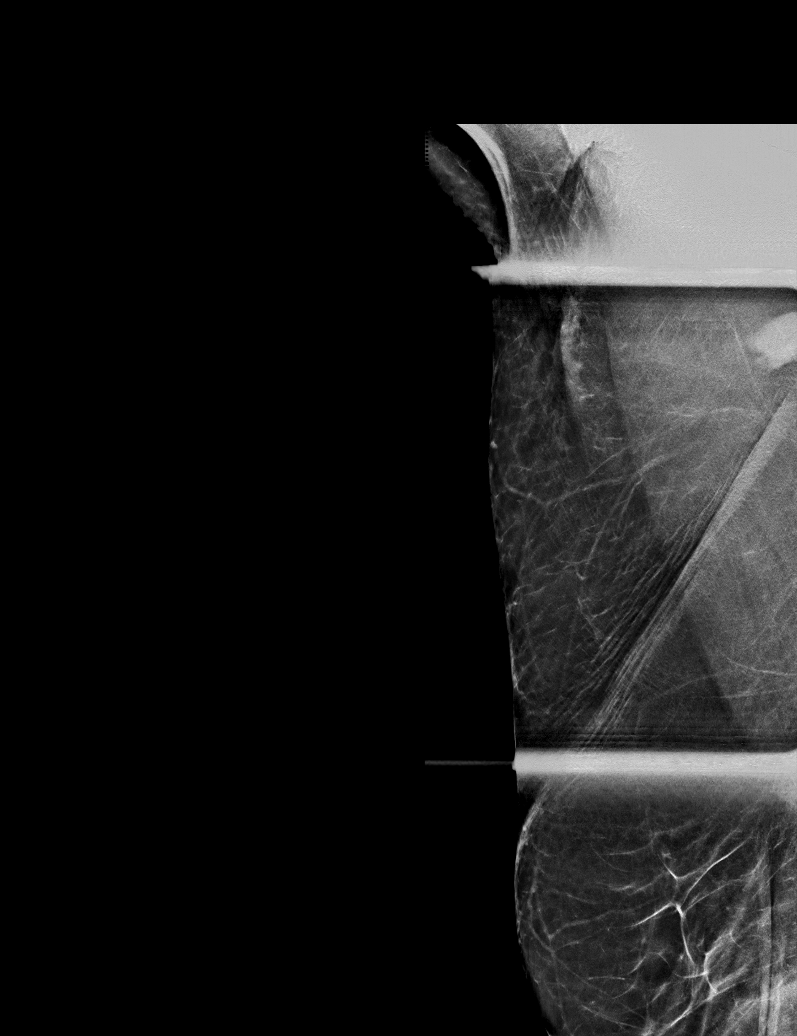

[R MLO synth-2D (2 of 2)]
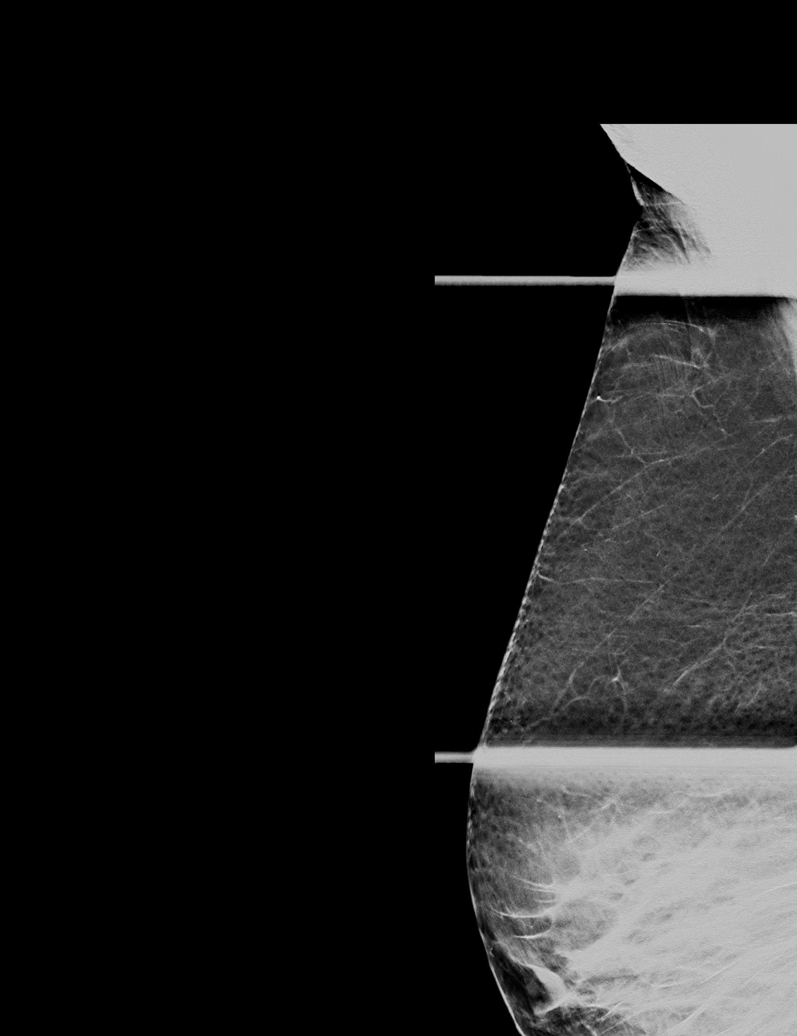

[R MLO tomo (1 of 2) · tomo slice 20/39.0]
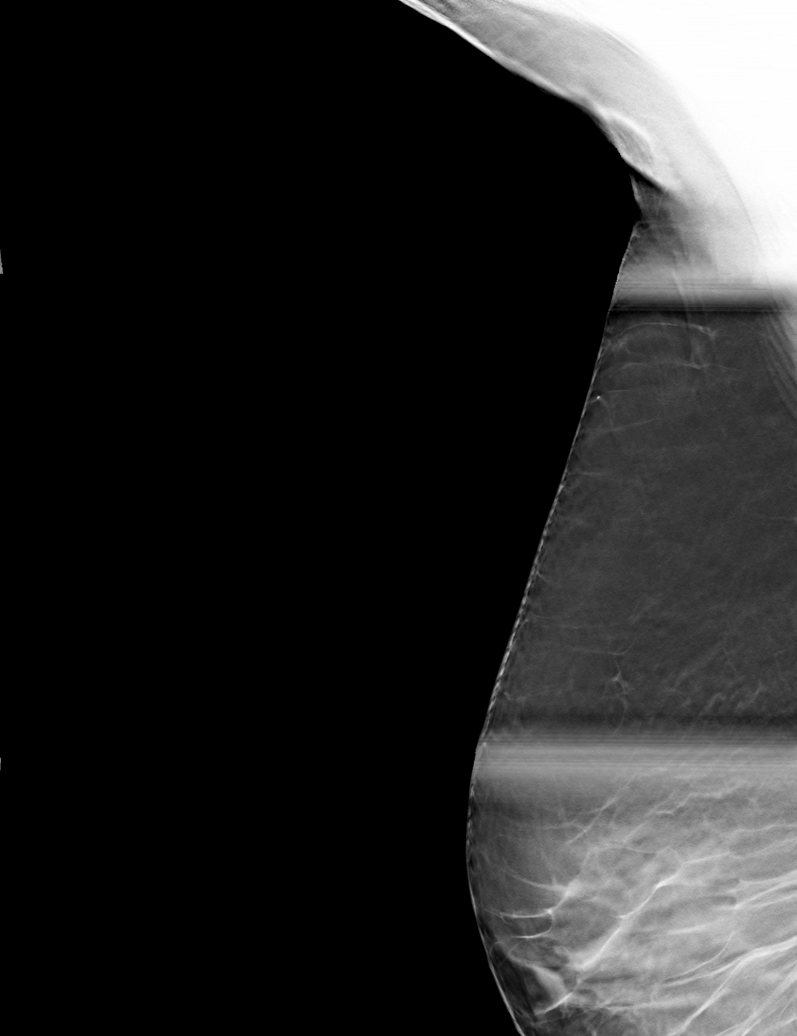

[R ML tomo · tomo slice 25/48.0]
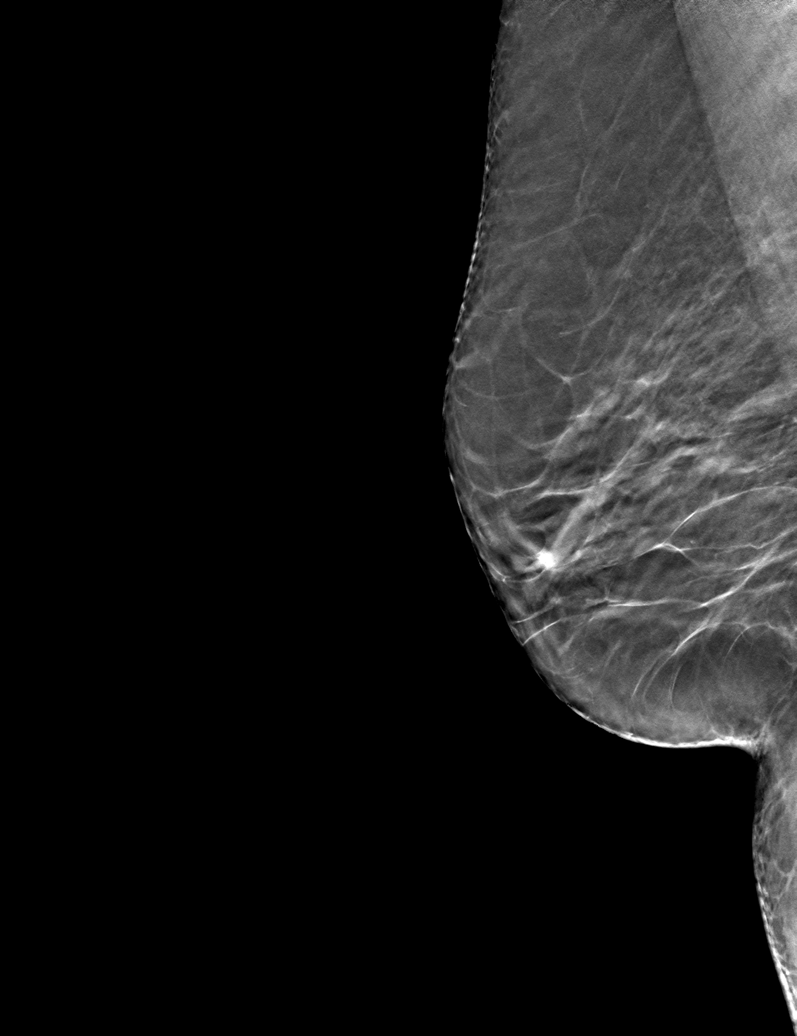

[R MLO tomo (2 of 2) · tomo slice 29/58.0]
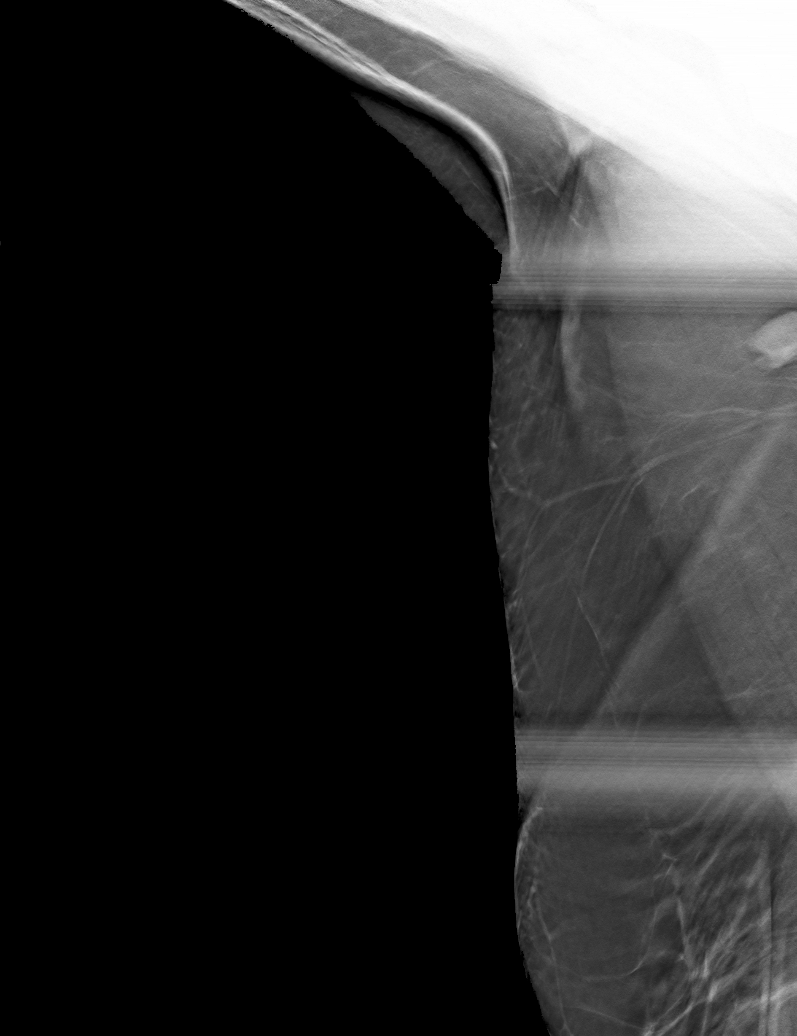

[6 of 18 positions shown; findings below may reference images not displayed]

ACR Breast Density Category b: There are scattered areas of
fibroglandular density.
FINDINGS: Spot compression tomosynthesis images over the right axilla
demonstrates a persistent oval partially visualized mass with a
suggestion of a central lucency. This most likely represents a lymph
node. Ultrasound will be performed for further evaluation.

Ultrasound of the right axilla demonstrates normal-appearing lymph
nodes.
IMPRESSION: Normal right axillary lymph nodes.

RECOMMENDATION:
Screening mammogram in one year.(Code:6X-I-ZAM)

I have discussed the findings and recommendations with the patient.
If applicable, a reminder letter will be sent to the patient
regarding the next appointment.

BI-RADS CATEGORY  1: Negative.

## 2022-09-30 ENCOUNTER — Other Ambulatory Visit (HOSPITAL_COMMUNITY): Payer: Self-pay | Admitting: Physician Assistant

## 2022-09-30 DIAGNOSIS — Z1231 Encounter for screening mammogram for malignant neoplasm of breast: Secondary | ICD-10-CM

## 2022-10-18 ENCOUNTER — Ambulatory Visit (HOSPITAL_COMMUNITY)
Admission: RE | Admit: 2022-10-18 | Discharge: 2022-10-18 | Disposition: A | Discharge status: Home / Self Care | Payer: Medicare Other | Source: Ambulatory Visit | Attending: Physician Assistant | Admitting: Physician Assistant

## 2022-10-18 DIAGNOSIS — Z1231 Encounter for screening mammogram for malignant neoplasm of breast: Secondary | ICD-10-CM | POA: Diagnosis present

## 2022-11-12 ENCOUNTER — Other Ambulatory Visit (HOSPITAL_BASED_OUTPATIENT_CLINIC_OR_DEPARTMENT_OTHER): Payer: Self-pay | Admitting: Family Medicine

## 2022-11-12 DIAGNOSIS — F17209 Nicotine dependence, unspecified, with unspecified nicotine-induced disorders: Secondary | ICD-10-CM

## 2022-12-23 ENCOUNTER — Ambulatory Visit (HOSPITAL_COMMUNITY)
Admission: RE | Admit: 2022-12-23 | Discharge: 2022-12-23 | Disposition: A | Discharge status: Home / Self Care | Payer: Medicare Other | Source: Ambulatory Visit | Attending: Family Medicine | Admitting: Family Medicine

## 2022-12-23 DIAGNOSIS — J432 Centrilobular emphysema: Secondary | ICD-10-CM | POA: Insufficient documentation

## 2022-12-23 DIAGNOSIS — F17209 Nicotine dependence, unspecified, with unspecified nicotine-induced disorders: Secondary | ICD-10-CM | POA: Diagnosis present

## 2022-12-23 DIAGNOSIS — I700 Atherosclerosis of aorta: Secondary | ICD-10-CM | POA: Insufficient documentation

## 2022-12-23 DIAGNOSIS — I2510 Atherosclerotic heart disease of native coronary artery without angina pectoris: Secondary | ICD-10-CM | POA: Insufficient documentation

## 2022-12-23 DIAGNOSIS — F17210 Nicotine dependence, cigarettes, uncomplicated: Secondary | ICD-10-CM | POA: Insufficient documentation

## 2022-12-23 DIAGNOSIS — Z122 Encounter for screening for malignant neoplasm of respiratory organs: Secondary | ICD-10-CM | POA: Insufficient documentation

## 2023-02-16 ENCOUNTER — Emergency Department (HOSPITAL_COMMUNITY): Payer: Medicare Other

## 2023-02-16 ENCOUNTER — Other Ambulatory Visit: Payer: Self-pay

## 2023-02-16 ENCOUNTER — Inpatient Hospital Stay (HOSPITAL_COMMUNITY)
Admission: EM | Admit: 2023-02-16 | Discharge: 2023-02-21 | DRG: 185 | Disposition: A | Discharge status: Home Health | Payer: Medicare Other | Attending: General Surgery | Admitting: General Surgery

## 2023-02-16 DIAGNOSIS — R748 Abnormal levels of other serum enzymes: Secondary | ICD-10-CM | POA: Diagnosis present

## 2023-02-16 DIAGNOSIS — I10 Essential (primary) hypertension: Secondary | ICD-10-CM | POA: Diagnosis present

## 2023-02-16 DIAGNOSIS — Z85828 Personal history of other malignant neoplasm of skin: Secondary | ICD-10-CM

## 2023-02-16 DIAGNOSIS — Z9079 Acquired absence of other genital organ(s): Secondary | ICD-10-CM

## 2023-02-16 DIAGNOSIS — R0902 Hypoxemia: Secondary | ICD-10-CM | POA: Diagnosis present

## 2023-02-16 DIAGNOSIS — F32A Depression, unspecified: Secondary | ICD-10-CM | POA: Diagnosis present

## 2023-02-16 DIAGNOSIS — S2243XA Multiple fractures of ribs, bilateral, initial encounter for closed fracture: Secondary | ICD-10-CM | POA: Diagnosis not present

## 2023-02-16 DIAGNOSIS — S2249XA Multiple fractures of ribs, unspecified side, initial encounter for closed fracture: Secondary | ICD-10-CM | POA: Diagnosis not present

## 2023-02-16 DIAGNOSIS — Z79899 Other long term (current) drug therapy: Secondary | ICD-10-CM

## 2023-02-16 DIAGNOSIS — K838 Other specified diseases of biliary tract: Secondary | ICD-10-CM | POA: Diagnosis present

## 2023-02-16 DIAGNOSIS — Z8249 Family history of ischemic heart disease and other diseases of the circulatory system: Secondary | ICD-10-CM

## 2023-02-16 DIAGNOSIS — Z808 Family history of malignant neoplasm of other organs or systems: Secondary | ICD-10-CM

## 2023-02-16 DIAGNOSIS — Z803 Family history of malignant neoplasm of breast: Secondary | ICD-10-CM

## 2023-02-16 DIAGNOSIS — Z806 Family history of leukemia: Secondary | ICD-10-CM

## 2023-02-16 DIAGNOSIS — R7989 Other specified abnormal findings of blood chemistry: Secondary | ICD-10-CM | POA: Diagnosis present

## 2023-02-16 DIAGNOSIS — R7303 Prediabetes: Secondary | ICD-10-CM | POA: Diagnosis present

## 2023-02-16 DIAGNOSIS — Z882 Allergy status to sulfonamides status: Secondary | ICD-10-CM

## 2023-02-16 DIAGNOSIS — Y92410 Unspecified street and highway as the place of occurrence of the external cause: Secondary | ICD-10-CM

## 2023-02-16 DIAGNOSIS — Z888 Allergy status to other drugs, medicaments and biological substances status: Secondary | ICD-10-CM

## 2023-02-16 DIAGNOSIS — Z7982 Long term (current) use of aspirin: Secondary | ICD-10-CM

## 2023-02-16 DIAGNOSIS — J439 Emphysema, unspecified: Secondary | ICD-10-CM | POA: Diagnosis present

## 2023-02-16 DIAGNOSIS — F17210 Nicotine dependence, cigarettes, uncomplicated: Secondary | ICD-10-CM | POA: Diagnosis present

## 2023-02-16 DIAGNOSIS — R519 Headache, unspecified: Secondary | ICD-10-CM | POA: Diagnosis present

## 2023-02-16 DIAGNOSIS — I700 Atherosclerosis of aorta: Secondary | ICD-10-CM | POA: Diagnosis present

## 2023-02-16 DIAGNOSIS — Z9889 Other specified postprocedural states: Secondary | ICD-10-CM

## 2023-02-16 DIAGNOSIS — Z8544 Personal history of malignant neoplasm of other female genital organs: Secondary | ICD-10-CM

## 2023-02-16 DIAGNOSIS — S2001XA Contusion of right breast, initial encounter: Secondary | ICD-10-CM | POA: Diagnosis present

## 2023-02-16 DIAGNOSIS — Z833 Family history of diabetes mellitus: Secondary | ICD-10-CM

## 2023-02-16 DIAGNOSIS — R109 Unspecified abdominal pain: Secondary | ICD-10-CM | POA: Diagnosis present

## 2023-02-16 DIAGNOSIS — E7800 Pure hypercholesterolemia, unspecified: Secondary | ICD-10-CM | POA: Diagnosis present

## 2023-02-16 DIAGNOSIS — Z8049 Family history of malignant neoplasm of other genital organs: Secondary | ICD-10-CM

## 2023-02-16 DIAGNOSIS — Z602 Problems related to living alone: Secondary | ICD-10-CM | POA: Diagnosis present

## 2023-02-16 DIAGNOSIS — Z885 Allergy status to narcotic agent status: Secondary | ICD-10-CM

## 2023-02-16 DIAGNOSIS — F419 Anxiety disorder, unspecified: Secondary | ICD-10-CM | POA: Diagnosis present

## 2023-02-16 DIAGNOSIS — M542 Cervicalgia: Secondary | ICD-10-CM | POA: Diagnosis present

## 2023-02-16 LAB — CBC WITH DIFFERENTIAL/PLATELET
Abs Immature Granulocytes: 0.06 10*3/uL (ref 0.00–0.07)
Basophils Absolute: 0 10*3/uL (ref 0.0–0.1)
Basophils Relative: 1 %
Eosinophils Absolute: 0.1 10*3/uL (ref 0.0–0.5)
Eosinophils Relative: 1 %
HCT: 42.8 % (ref 36.0–46.0)
Hemoglobin: 15 g/dL (ref 12.0–15.0)
Immature Granulocytes: 1 %
Lymphocytes Relative: 12 %
Lymphs Abs: 0.9 10*3/uL (ref 0.7–4.0)
MCH: 30.4 pg (ref 26.0–34.0)
MCHC: 35 g/dL (ref 30.0–36.0)
MCV: 86.6 fL (ref 80.0–100.0)
Monocytes Absolute: 0.5 10*3/uL (ref 0.1–1.0)
Monocytes Relative: 7 %
Neutro Abs: 6.1 10*3/uL (ref 1.7–7.7)
Neutrophils Relative %: 78 %
Platelets: 142 10*3/uL — ABNORMAL LOW (ref 150–400)
RBC: 4.94 MIL/uL (ref 3.87–5.11)
RDW: 13.1 % (ref 11.5–15.5)
WBC: 7.7 10*3/uL (ref 4.0–10.5)
nRBC: 0 % (ref 0.0–0.2)

## 2023-02-16 MED ORDER — FENTANYL CITRATE PF 50 MCG/ML IJ SOSY
50.0000 ug | PREFILLED_SYRINGE | Freq: Once | INTRAMUSCULAR | Status: AC
  Administered 2023-02-16: 50 ug via INTRAVENOUS
  Filled 2023-02-16: qty 1

## 2023-02-16 MED ORDER — LORAZEPAM 2 MG/ML IJ SOLN
1.0000 mg | Freq: Once | INTRAMUSCULAR | Status: AC
  Administered 2023-02-16: 1 mg via INTRAVENOUS
  Filled 2023-02-16: qty 1

## 2023-02-16 NOTE — ED Notes (Signed)
EDPA Provider at bedside. 
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
## 2023-02-16 NOTE — ED Notes (Signed)
Pt c/o tingling sensation all over. Pt is noted to be shaking. EDP made aware.

## 2023-02-16 NOTE — ED Notes (Signed)
Pt placed on 2L [place] for comfort. Pt's O2 90% after receiving medication

## 2023-02-17 ENCOUNTER — Inpatient Hospital Stay (HOSPITAL_COMMUNITY): Payer: Medicare Other

## 2023-02-17 ENCOUNTER — Emergency Department (HOSPITAL_COMMUNITY): Payer: Medicare Other

## 2023-02-17 DIAGNOSIS — R7989 Other specified abnormal findings of blood chemistry: Secondary | ICD-10-CM | POA: Diagnosis present

## 2023-02-17 DIAGNOSIS — E7800 Pure hypercholesterolemia, unspecified: Secondary | ICD-10-CM | POA: Diagnosis present

## 2023-02-17 DIAGNOSIS — F32A Depression, unspecified: Secondary | ICD-10-CM | POA: Diagnosis present

## 2023-02-17 DIAGNOSIS — J439 Emphysema, unspecified: Secondary | ICD-10-CM | POA: Diagnosis present

## 2023-02-17 DIAGNOSIS — R0902 Hypoxemia: Secondary | ICD-10-CM | POA: Diagnosis present

## 2023-02-17 DIAGNOSIS — Z8544 Personal history of malignant neoplasm of other female genital organs: Secondary | ICD-10-CM | POA: Diagnosis not present

## 2023-02-17 DIAGNOSIS — Z79899 Other long term (current) drug therapy: Secondary | ICD-10-CM | POA: Diagnosis not present

## 2023-02-17 DIAGNOSIS — R7303 Prediabetes: Secondary | ICD-10-CM | POA: Diagnosis present

## 2023-02-17 DIAGNOSIS — Z9079 Acquired absence of other genital organ(s): Secondary | ICD-10-CM | POA: Diagnosis not present

## 2023-02-17 DIAGNOSIS — Z7982 Long term (current) use of aspirin: Secondary | ICD-10-CM | POA: Diagnosis not present

## 2023-02-17 DIAGNOSIS — F17210 Nicotine dependence, cigarettes, uncomplicated: Secondary | ICD-10-CM | POA: Diagnosis present

## 2023-02-17 DIAGNOSIS — R109 Unspecified abdominal pain: Secondary | ICD-10-CM | POA: Diagnosis present

## 2023-02-17 DIAGNOSIS — I10 Essential (primary) hypertension: Secondary | ICD-10-CM | POA: Diagnosis present

## 2023-02-17 DIAGNOSIS — M542 Cervicalgia: Secondary | ICD-10-CM | POA: Diagnosis present

## 2023-02-17 DIAGNOSIS — K838 Other specified diseases of biliary tract: Secondary | ICD-10-CM | POA: Diagnosis present

## 2023-02-17 DIAGNOSIS — F419 Anxiety disorder, unspecified: Secondary | ICD-10-CM | POA: Diagnosis present

## 2023-02-17 DIAGNOSIS — S2249XA Multiple fractures of ribs, unspecified side, initial encounter for closed fracture: Secondary | ICD-10-CM | POA: Diagnosis present

## 2023-02-17 DIAGNOSIS — I700 Atherosclerosis of aorta: Secondary | ICD-10-CM | POA: Diagnosis present

## 2023-02-17 DIAGNOSIS — Z602 Problems related to living alone: Secondary | ICD-10-CM | POA: Diagnosis present

## 2023-02-17 DIAGNOSIS — S2243XA Multiple fractures of ribs, bilateral, initial encounter for closed fracture: Secondary | ICD-10-CM | POA: Diagnosis present

## 2023-02-17 DIAGNOSIS — Z9889 Other specified postprocedural states: Secondary | ICD-10-CM | POA: Diagnosis not present

## 2023-02-17 DIAGNOSIS — R519 Headache, unspecified: Secondary | ICD-10-CM | POA: Diagnosis present

## 2023-02-17 DIAGNOSIS — Z885 Allergy status to narcotic agent status: Secondary | ICD-10-CM | POA: Diagnosis not present

## 2023-02-17 DIAGNOSIS — S2001XA Contusion of right breast, initial encounter: Secondary | ICD-10-CM | POA: Diagnosis present

## 2023-02-17 DIAGNOSIS — Z85828 Personal history of other malignant neoplasm of skin: Secondary | ICD-10-CM | POA: Diagnosis not present

## 2023-02-17 DIAGNOSIS — Y92410 Unspecified street and highway as the place of occurrence of the external cause: Secondary | ICD-10-CM | POA: Diagnosis not present

## 2023-02-17 LAB — CBC
HCT: 39.3 % (ref 36.0–46.0)
Hemoglobin: 13.5 g/dL (ref 12.0–15.0)
MCH: 30 pg (ref 26.0–34.0)
MCHC: 34.4 g/dL (ref 30.0–36.0)
MCV: 87.3 fL (ref 80.0–100.0)
Platelets: 133 10*3/uL — ABNORMAL LOW (ref 150–400)
RBC: 4.5 MIL/uL (ref 3.87–5.11)
RDW: 13.1 % (ref 11.5–15.5)
WBC: 7.5 10*3/uL (ref 4.0–10.5)
nRBC: 0 % (ref 0.0–0.2)

## 2023-02-17 LAB — COMPREHENSIVE METABOLIC PANEL
ALT: 38 U/L (ref 0–44)
AST: 48 U/L — ABNORMAL HIGH (ref 15–41)
Albumin: 4.4 g/dL (ref 3.5–5.0)
Alkaline Phosphatase: 53 U/L (ref 38–126)
Anion gap: 9 (ref 5–15)
BUN: 17 mg/dL (ref 8–23)
CO2: 27 mmol/L (ref 22–32)
Calcium: 9 mg/dL (ref 8.9–10.3)
Chloride: 99 mmol/L (ref 98–111)
Creatinine, Ser: 1.03 mg/dL — ABNORMAL HIGH (ref 0.44–1.00)
GFR, Estimated: 58 mL/min — ABNORMAL LOW (ref >60)
Glucose, Bld: 143 mg/dL — ABNORMAL HIGH (ref 70–99)
Potassium: 3.6 mmol/L (ref 3.5–5.1)
Sodium: 135 mmol/L (ref 135–145)
Total Bilirubin: 0.8 mg/dL (ref 0.3–1.2)
Total Protein: 7.9 g/dL (ref 6.5–8.1)

## 2023-02-17 LAB — CREATININE, SERUM
Creatinine, Ser: 1.12 mg/dL — ABNORMAL HIGH (ref 0.44–1.00)
GFR, Estimated: 52 mL/min — ABNORMAL LOW (ref >60)

## 2023-02-17 LAB — PROTIME-INR
INR: 1 (ref 0.8–1.2)
Prothrombin Time: 13.1 s (ref 11.4–15.2)

## 2023-02-17 MED ORDER — ONDANSETRON 4 MG PO TBDP: 4.0000 mg | ORAL_TABLET | Freq: Four times a day (QID) | ORAL | Status: DC | PRN

## 2023-02-17 MED ORDER — POLYETHYLENE GLYCOL 3350 17 G PO PACK: 17.0000 g | PACK | Freq: Every day | ORAL | Status: DC | PRN

## 2023-02-17 MED ORDER — IOHEXOL 300 MG/ML  SOLN
100.0000 mL | Freq: Once | INTRAMUSCULAR | Status: AC | PRN
  Administered 2023-02-17: 100 mL via INTRAVENOUS

## 2023-02-17 MED ORDER — LOSARTAN POTASSIUM-HCTZ 100-25 MG PO TABS: 1.0000 | ORAL_TABLET | Freq: Every day | ORAL | Status: DC

## 2023-02-17 MED ORDER — HYDROMORPHONE HCL 1 MG/ML IJ SOLN
1.0000 mg | Freq: Once | INTRAMUSCULAR | Status: AC
  Administered 2023-02-17: 1 mg via INTRAVENOUS
  Filled 2023-02-17: qty 1

## 2023-02-17 MED ORDER — DOCUSATE SODIUM 100 MG PO CAPS
100.0000 mg | ORAL_CAPSULE | Freq: Two times a day (BID) | ORAL | Status: DC
  Administered 2023-02-17 – 2023-02-21 (×9): 100 mg via ORAL
  Filled 2023-02-17 (×9): qty 1

## 2023-02-17 MED ORDER — METHOCARBAMOL 1000 MG/10ML IJ SOLN
500.0000 mg | Freq: Three times a day (TID) | INTRAVENOUS | Status: DC
  Filled 2023-02-17: qty 5

## 2023-02-17 MED ORDER — ONDANSETRON HCL 4 MG/2ML IJ SOLN: 4.0000 mg | Freq: Four times a day (QID) | INTRAMUSCULAR | Status: DC | PRN

## 2023-02-17 MED ORDER — HYDROCODONE-ACETAMINOPHEN 5-325 MG PO TABS: 1.0000 | ORAL_TABLET | ORAL | Status: DC | PRN

## 2023-02-17 MED ORDER — IOHEXOL 350 MG/ML SOLN
75.0000 mL | Freq: Once | INTRAVENOUS | Status: AC | PRN
  Administered 2023-02-17: 75 mL via INTRAVENOUS

## 2023-02-17 MED ORDER — METHOCARBAMOL 500 MG PO TABS
500.0000 mg | ORAL_TABLET | Freq: Three times a day (TID) | ORAL | Status: DC
  Administered 2023-02-17 – 2023-02-18 (×3): 500 mg via ORAL
  Filled 2023-02-17 (×3): qty 1

## 2023-02-17 MED ORDER — ALPRAZOLAM 0.5 MG PO TABS
0.5000 mg | ORAL_TABLET | Freq: Two times a day (BID) | ORAL | Status: AC | PRN
  Administered 2023-02-17 (×2): 0.5 mg via ORAL
  Filled 2023-02-17 (×2): qty 1

## 2023-02-17 MED ORDER — LOSARTAN POTASSIUM 50 MG PO TABS
100.0000 mg | ORAL_TABLET | Freq: Every day | ORAL | Status: DC
  Administered 2023-02-17 – 2023-02-19 (×3): 100 mg via ORAL
  Filled 2023-02-17 (×3): qty 2

## 2023-02-17 MED ORDER — METOPROLOL TARTRATE 5 MG/5ML IV SOLN: 5.0000 mg | Freq: Four times a day (QID) | INTRAVENOUS | Status: DC | PRN

## 2023-02-17 MED ORDER — HYDROCHLOROTHIAZIDE 25 MG PO TABS
25.0000 mg | ORAL_TABLET | Freq: Every day | ORAL | Status: DC
  Administered 2023-02-17 – 2023-02-19 (×3): 25 mg via ORAL
  Filled 2023-02-17 (×3): qty 1

## 2023-02-17 MED ORDER — ENOXAPARIN SODIUM 30 MG/0.3ML IJ SOSY
30.0000 mg | PREFILLED_SYRINGE | Freq: Two times a day (BID) | INTRAMUSCULAR | Status: DC
  Administered 2023-02-18 – 2023-02-21 (×7): 30 mg via SUBCUTANEOUS
  Filled 2023-02-17 (×7): qty 0.3

## 2023-02-17 MED ORDER — HYDROMORPHONE HCL 1 MG/ML IJ SOLN
0.5000 mg | Freq: Once | INTRAMUSCULAR | Status: AC
  Administered 2023-02-17: 0.5 mg via INTRAVENOUS
  Filled 2023-02-17: qty 0.5

## 2023-02-17 MED ORDER — HYDROMORPHONE HCL 1 MG/ML IJ SOLN
0.5000 mg | INTRAMUSCULAR | Status: DC | PRN
  Administered 2023-02-17 – 2023-02-18 (×3): 0.5 mg via INTRAVENOUS
  Filled 2023-02-17 (×3): qty 0.5

## 2023-02-17 MED ORDER — ALPRAZOLAM 0.5 MG PO TABS: 0.5000 mg | ORAL_TABLET | Freq: Two times a day (BID) | ORAL | Status: DC | PRN

## 2023-02-17 MED ORDER — HYDRALAZINE HCL 20 MG/ML IJ SOLN: 10.0000 mg | INTRAMUSCULAR | Status: DC | PRN

## 2023-02-17 NOTE — ED Notes (Signed)
ED Provider at bedside. 

## 2023-02-17 NOTE — ED Notes (Signed)
Patient transported to CT 
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
## 2023-02-17 NOTE — ED Notes (Signed)
Report given to [facility], they stated they would be here to pick up in 25 minutes

## 2023-02-17 NOTE — Progress Notes (Signed)
PT Cancellation Note  Patient Details Name: TMYA PIZZOFERRATO MRN: [id number] DOB: 1950-06-22   Cancelled Treatment:    Reason Eval/Treat Not Completed: Patient at procedure or test/unavailable - going for CT scan upon PT arrival to room, PT to check back at a later time.   Marye Round, PT DPT [facility] Secure Chat Preferred  Office [phone]    Truddie Coco 02/17/2023, 3:25 PM

## 2023-02-17 NOTE — ED Notes (Signed)
Pt's O2 sat at 88% while pt is at rest and relaxed. Pt placed back on 2L [place]. O2 sat back at 94%

## 2023-02-18 LAB — CBC
HCT: 40 % (ref 36.0–46.0)
Hemoglobin: 13.6 g/dL (ref 12.0–15.0)
MCH: 30.1 pg (ref 26.0–34.0)
MCHC: 34 g/dL (ref 30.0–36.0)
MCV: 88.5 fL (ref 80.0–100.0)
Platelets: 150 10*3/uL (ref 150–400)
RBC: 4.52 MIL/uL (ref 3.87–5.11)
RDW: 13.3 % (ref 11.5–15.5)
WBC: 9.1 10*3/uL (ref 4.0–10.5)
nRBC: 0 % (ref 0.0–0.2)

## 2023-02-18 LAB — BASIC METABOLIC PANEL
Anion gap: 17 — ABNORMAL HIGH (ref 5–15)
BUN: 27 mg/dL — ABNORMAL HIGH (ref 8–23)
CO2: 27 mmol/L (ref 22–32)
Calcium: 8.7 mg/dL — ABNORMAL LOW (ref 8.9–10.3)
Chloride: 93 mmol/L — ABNORMAL LOW (ref 98–111)
Creatinine, Ser: 1.41 mg/dL — ABNORMAL HIGH (ref 0.44–1.00)
GFR, Estimated: 40 mL/min — ABNORMAL LOW (ref >60)
Glucose, Bld: 116 mg/dL — ABNORMAL HIGH (ref 70–99)
Potassium: 3.9 mmol/L (ref 3.5–5.1)
Sodium: 137 mmol/L (ref 135–145)

## 2023-02-18 LAB — HEPATIC FUNCTION PANEL
ALT: 28 U/L (ref 0–44)
AST: 31 U/L (ref 15–41)
Albumin: 3.8 g/dL (ref 3.5–5.0)
Alkaline Phosphatase: 44 U/L (ref 38–126)
Bilirubin, Direct: 0.2 mg/dL (ref 0.0–0.2)
Indirect Bilirubin: 1.1 mg/dL — ABNORMAL HIGH (ref 0.3–0.9)
Total Bilirubin: 1.3 mg/dL — ABNORMAL HIGH (ref 0.3–1.2)
Total Protein: 6.4 g/dL — ABNORMAL LOW (ref 6.5–8.1)

## 2023-02-18 MED ORDER — METHOCARBAMOL 750 MG PO TABS
750.0000 mg | ORAL_TABLET | Freq: Three times a day (TID) | ORAL | Status: AC
  Administered 2023-02-18 – 2023-02-20 (×6): 750 mg via ORAL
  Filled 2023-02-18 (×6): qty 1

## 2023-02-18 MED ORDER — ACETAMINOPHEN 325 MG PO TABS
650.0000 mg | ORAL_TABLET | Freq: Four times a day (QID) | ORAL | Status: DC | PRN
  Administered 2023-02-18: 650 mg via ORAL
  Filled 2023-02-18: qty 2

## 2023-02-18 MED ORDER — LIDOCAINE 5 % EX PTCH
1.0000 | MEDICATED_PATCH | CUTANEOUS | Status: DC
  Administered 2023-02-18 – 2023-02-20 (×3): 1 via TRANSDERMAL
  Filled 2023-02-18 (×4): qty 1

## 2023-02-18 MED ORDER — TRAMADOL HCL 50 MG PO TABS
50.0000 mg | ORAL_TABLET | Freq: Four times a day (QID) | ORAL | Status: DC | PRN
  Administered 2023-02-18: 50 mg via ORAL
  Administered 2023-02-19 – 2023-02-20 (×3): 100 mg via ORAL
  Filled 2023-02-18 (×2): qty 2
  Filled 2023-02-18: qty 1
  Filled 2023-02-18: qty 2

## 2023-02-18 MED ORDER — POLYETHYLENE GLYCOL 3350 17 G PO PACK
17.0000 g | PACK | Freq: Every day | ORAL | Status: DC
  Administered 2023-02-18 – 2023-02-21 (×4): 17 g via ORAL
  Filled 2023-02-18 (×4): qty 1

## 2023-02-18 MED ORDER — ACETAMINOPHEN 500 MG PO TABS
1000.0000 mg | ORAL_TABLET | Freq: Four times a day (QID) | ORAL | Status: DC
  Administered 2023-02-18 – 2023-02-21 (×11): 1000 mg via ORAL
  Filled 2023-02-18 (×12): qty 2

## 2023-02-18 MED ORDER — ALPRAZOLAM 0.5 MG PO TABS
0.5000 mg | ORAL_TABLET | Freq: Three times a day (TID) | ORAL | Status: DC | PRN
  Administered 2023-02-18 – 2023-02-21 (×8): 0.5 mg via ORAL
  Filled 2023-02-18 (×8): qty 1

## 2023-02-18 NOTE — Plan of Care (Signed)
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
## 2023-02-18 NOTE — Plan of Care (Signed)
Patient is alert/oriented X4. Patient compliant with medication administration and administered dilaudid and ultram as needed for pain. Patient was up in the chair for a few hours and oxygen weaned down to 1L oxygen. Will continue to wean. VSS. Will continue to monitor.   Problem: Education: Goal: Knowledge of General Education information will improve Description: Including pain rating scale, medication(s)/side effects and non-pharmacologic comfort measures Outcome: Progressing   Problem: Health Behavior/Discharge Planning: Goal: Ability to manage health-related needs will improve Outcome: Progressing   Problem: Clinical Measurements: Goal: Ability to maintain clinical measurements within normal limits will improve Outcome: Progressing   Problem: Clinical Measurements: Goal: Will remain free from infection Outcome: Progressing   Problem: Clinical Measurements: Goal: Diagnostic test results will improve Outcome: Progressing   Problem: Clinical Measurements: Goal: Respiratory complications will improve Outcome: Progressing   Problem: Clinical Measurements: Goal: Cardiovascular complication will be avoided Outcome: Progressing   Problem: Activity: Goal: Risk for activity intolerance will decrease Outcome: Progressing   Problem: Coping: Goal: Level of anxiety will decrease Outcome: Progressing   Problem: Elimination: Goal: Will not experience complications related to urinary retention Outcome: Progressing   Problem: Pain Managment: Goal: General experience of comfort will improve Outcome: Progressing   Problem: Safety: Goal: Ability to remain free from injury will improve Outcome: Progressing   Problem: Skin Integrity: Goal: Risk for impaired skin integrity will decrease Outcome: Progressing

## 2023-02-18 NOTE — Progress Notes (Signed)
PT Cancellation Note  Patient Details Name: Jeanette Mora MRN: [id number] DOB: Jun 18, 1950   Cancelled Treatment:    Reason Eval/Treat Not Completed: Pain limiting ability to participate at this time, asking for pain medication prior to mobility attempts. Will contact RN about premedicating and re-attempt evaluation later today.   Vickki Muff, PT, DPT   Acute Rehabilitation Department Office [phone] Secure Chat Communication Preferred   Ronnie Derby 02/18/2023, 11:37 AM
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
## 2023-02-19 ENCOUNTER — Inpatient Hospital Stay (HOSPITAL_COMMUNITY): Payer: Medicare Other

## 2023-02-19 NOTE — Progress Notes (Signed)
SATURATION QUALIFICATIONS: (This note is used to comply with regulatory documentation for home oxygen)  Patient Saturations on Room Air at Rest = 91%  Patient Saturations on Room Air while Ambulating = 85%  Patient Saturations on 2 Liters of oxygen while Ambulating = 93%  Please briefly explain why patient needs home oxygen: Pt unable to maintain Spo2 >90% on RA with mobility.   Vickki Muff, PT, DPT   Acute Rehabilitation Department Office [phone] Secure Chat Communication Preferred

## 2023-02-19 NOTE — TOC CAGE-AID Note (Signed)
Transition of Care [facility]) - CAGE-AID Screening   Patient Details  Name: Jeanette Mora MRN: [id number] Date of Birth: [birth year]  Transition of Care [facility]) CM/SW Contact:    Leota Sauers, RN Phone Number: 02/19/2023, 4:18 AM   Clinical Narrative:  Patient denies use of alcohol and illicit drugs. Resources not given at this time.   CAGE-AID Screening:    Have You Ever Felt You Ought to Cut Down on Your Drinking or Drug Use?: No Have People Annoyed You By Critizing Your Drinking Or Drug Use?: No Have You Felt Bad Or Guilty About Your Drinking Or Drug Use?: No Have You Ever Had a Drink or Used Drugs First Thing In The Morning to Steady Your Nerves or to Get Rid of a Hangover?: No CAGE-AID Score: 0  Substance Abuse Education Offered: No
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
## 2023-02-19 NOTE — Plan of Care (Signed)
Patient alert/oriented X4. Patient compliant with medication administration and xanax given as needed for anxiety. Patient ambulated to the bathroom with minimal assist. Patient complains of no pain. Patient requires 2L nasal cannula with ambulation. VSS  Problem: Education: Goal: Knowledge of General Education information will improve Description: Including pain rating scale, medication(s)/side effects and non-pharmacologic comfort measures Outcome: Progressing   Problem: Health Behavior/Discharge Planning: Goal: Ability to manage health-related needs will improve Outcome: Progressing   Problem: Clinical Measurements: Goal: Ability to maintain clinical measurements within normal limits will improve Outcome: Progressing   Problem: Clinical Measurements: Goal: Will remain free from infection Outcome: Progressing   Problem: Clinical Measurements: Goal: Diagnostic test results will improve Outcome: Progressing   Problem: Clinical Measurements: Goal: Respiratory complications will improve Outcome: Progressing   Problem: Clinical Measurements: Goal: Cardiovascular complication will be avoided Outcome: Progressing   Problem: Activity: Goal: Risk for activity intolerance will decrease Outcome: Progressing   Problem: Nutrition: Goal: Adequate nutrition will be maintained Outcome: Progressing   Problem: Coping: Goal: Level of anxiety will decrease Outcome: Progressing   Problem: Elimination: Goal: Will not experience complications related to bowel motility Outcome: Progressing   Problem: Elimination: Goal: Will not experience complications related to urinary retention Outcome: Progressing   Problem: Pain Managment: Goal: General experience of comfort will improve Outcome: Progressing   Problem: Safety: Goal: Ability to remain free from injury will improve Outcome: Progressing   Problem: Skin Integrity: Goal: Risk for impaired skin integrity will decrease Outcome:  Progressing
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
## 2023-02-20 LAB — CBC WITH DIFFERENTIAL/PLATELET
Abs Immature Granulocytes: 0.03 10*3/uL (ref 0.00–0.07)
Basophils Absolute: 0 10*3/uL (ref 0.0–0.1)
Basophils Relative: 1 %
Eosinophils Absolute: 0.3 10*3/uL (ref 0.0–0.5)
Eosinophils Relative: 4 %
HCT: 34.7 % — ABNORMAL LOW (ref 36.0–46.0)
Hemoglobin: 12.2 g/dL (ref 12.0–15.0)
Immature Granulocytes: 0 %
Lymphocytes Relative: 25 %
Lymphs Abs: 1.7 10*3/uL (ref 0.7–4.0)
MCH: 29.3 pg (ref 26.0–34.0)
MCHC: 35.2 g/dL (ref 30.0–36.0)
MCV: 83.4 fL (ref 80.0–100.0)
Monocytes Absolute: 0.5 10*3/uL (ref 0.1–1.0)
Monocytes Relative: 7 %
Neutro Abs: 4.3 10*3/uL (ref 1.7–7.7)
Neutrophils Relative %: 63 %
Platelets: 145 10*3/uL — ABNORMAL LOW (ref 150–400)
RBC: 4.16 MIL/uL (ref 3.87–5.11)
RDW: 12.5 % (ref 11.5–15.5)
WBC: 6.7 10*3/uL (ref 4.0–10.5)
nRBC: 0 % (ref 0.0–0.2)
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
## 2023-02-20 NOTE — Plan of Care (Signed)
Problem: Pain Managment: Goal: General experience of comfort will improve Outcome: Progressing

## 2023-02-20 NOTE — TOC Initial Note (Signed)
Transition of Care [facility]) - Initial/Assessment Note    Patient Details  Name: Jeanette Mora MRN: [id number] Date of Birth: 04-16-1951  Transition of Care [facility]) CM/SW Contact:    Lawerance Sabal, RN Phone Number: 02/20/2023, 12:34 PM  Clinical Narrative:                 [name] w patient over the phone, she is agreeable to [facility] services, [facility] able to accept.  RW and oxygen have been ordered through [employer] to be delivered to her room today   Expected Discharge Plan: Home w Home Health Services Barriers to Discharge: Continued Medical Work up   Patient Goals and CMS Choice Patient states their goals for this hospitalization and ongoing recovery are:: to go home CMS Medicare.gov Compare Post Acute Care list provided to:: Patient Choice offered to / list presented to : Patient      Expected Discharge Plan and Services   Discharge Planning Services: CM Consult Post Acute Care Choice: Durable Medical Equipment, Home Health Living arrangements for the past 2 months: Single Family Home                 DME Arranged: Oxygen, Walker rolling DME Agency: [employer] Date DME Agency Contacted: 02/20/23 Time DME Agency Contacted: 1233 Representative spoke with at DME Agency: Vaughan Basta HH Arranged: PT HH Agency: [facility] Date [facility] Agency Contacted: 02/20/23 Time HH Agency Contacted: 1234 Representative spoke with at [facility] Agency: Elnita Maxwell  Prior Living Arrangements/Services Living arrangements for the past 2 months: Single Family Home     Do you feel safe going back to the place where you live?: Yes               Activities of Daily Living Home Assistive Devices/Equipment: None ADL Screening (condition at time of admission) Patient's cognitive ability adequate to safely complete daily activities?: Yes Is the patient deaf or have difficulty hearing?: No Does the patient have difficulty seeing, even when wearing glasses/contacts?: No Does the patient have  difficulty concentrating, remembering, or making decisions?: No Patient able to express need for assistance with ADLs?: Yes Does the patient have difficulty dressing or bathing?: Yes Independently performs ADLs?: No Communication: Independent Dressing (OT): Needs assistance Is this a change from baseline?: Change from baseline, expected to last <3days Grooming: Needs assistance Is this a change from baseline?: Change from baseline, expected to last <3 days Feeding: Independent Bathing: Needs assistance Is this a change from baseline?: Change from baseline, expected to last <3 days Toileting: Needs assistance Is this a change from baseline?: Change from baseline, expected to last <3 days In/Out Bed: Needs assistance Is this a change from baseline?: Change from baseline, expected to last <3 days Walks in Home: Independent Does the patient have difficulty walking or climbing stairs?: Yes Weakness of Legs: Both Weakness of Arms/Hands: Both  Permission Sought/Granted                  Emotional Assessment              Admission diagnosis:  Rib fractures [S22.49XA] Closed fracture of multiple ribs of both sides, initial encounter [S22.43XA] Patient Active Problem List   Diagnosis Date Noted   Rib fractures 02/17/2023   Vulvar cancer (HCC) 11/15/2017   Finger injury 01/18/2013   Sprain of left index finger 01/18/2013   PCP:  Odis Luster, PA-C Pharmacy:   [facility] [phone] - [place], [place] - [address] AT [facility] OF  [address] & [address] [place] [address] Phone: [phone] Fax: [phone]     Social Determinants of Health (SDOH) Social History: SDOH Screenings   Food Insecurity: No Food Insecurity (02/17/2023)  Housing: Patient Declined (02/17/2023)  Transportation Needs: No Transportation Needs (02/17/2023)  Utilities: Not At Risk (02/17/2023)  Tobacco Use: Medium Risk (11/23/2022)   Received from [facility]   SDOH  Interventions:     Readmission Risk Interventions     No data to display

## 2023-02-21 LAB — BASIC METABOLIC PANEL
Anion gap: 11 (ref 5–15)
BUN: 15 mg/dL (ref 8–23)
CO2: 26 mmol/L (ref 22–32)
Calcium: 8.2 mg/dL — ABNORMAL LOW (ref 8.9–10.3)
Chloride: 96 mmol/L — ABNORMAL LOW (ref 98–111)
Creatinine, Ser: 0.79 mg/dL (ref 0.44–1.00)
GFR, Estimated: >60 mL/min (ref >60)
Glucose, Bld: 185 mg/dL — ABNORMAL HIGH (ref 70–99)
Potassium: 2.9 mmol/L — ABNORMAL LOW (ref 3.5–5.1)
Sodium: 133 mmol/L — ABNORMAL LOW (ref 135–145)

## 2023-02-21 MED ORDER — TRAMADOL HCL 50 MG PO TABS: 50.0000 mg | ORAL_TABLET | Freq: Four times a day (QID) | ORAL | 0 refills | Status: AC | PRN

## 2023-02-21 MED ORDER — POLYETHYLENE GLYCOL 3350 17 G PO PACK: 17.0000 g | PACK | Freq: Every day | ORAL | Status: AC | PRN

## 2023-02-21 MED ORDER — LIDOCAINE 5 % EX PTCH: 1.0000 | MEDICATED_PATCH | CUTANEOUS | 0 refills | Status: AC
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
## 2023-02-21 NOTE — TOC Transition Note (Signed)
Transition of Care [facility]) - CM/SW Discharge Note   Patient Details  Name: Jeanette Mora MRN: [id number] Date of Birth: 1950/08/27  [facility]) CM/SW Contact:  Gordy Clement, RN Phone Number: 02/21/2023, 9:09 AM   Clinical Narrative:     Patient to DC to home today.  Home Health will be provided by [facility].  Home O2 and rolling walker have been provided by [employer]. AVS has been updated. Daughter, Karoline Caldwell  to transport.   There are no additional TOC needs         Barriers to Discharge: Continued Medical Work up   Patient Goals and CMS Choice CMS Medicare.gov Compare Post Acute Care list provided to:: Patient Choice offered to / list presented to : Patient  Discharge Placement                         Discharge Plan and Services Additional resources added to the After Visit Summary for     Discharge Planning Services: CM Consult Post Acute Care Choice: Durable Medical Equipment, Home Health          DME Arranged: Oxygen, Walker rolling DME Agency: [employer] Date DME Agency Contacted: 02/20/23 Time DME Agency Contacted: 1233 Representative spoke with at DME Agency: Vaughan Basta HH Arranged: PT HH Agency: [facility] Date [facility] Agency Contacted: 02/20/23 Time HH Agency Contacted: 1234 Representative spoke with at [facility] Agency: Elnita Maxwell  Social Determinants of Health (SDOH) Interventions SDOH Screenings   Food Insecurity: No Food Insecurity (02/17/2023)  Housing: Patient Declined (02/17/2023)  Transportation Needs: No Transportation Needs (02/17/2023)  Utilities: Not At Risk (02/17/2023)  Tobacco Use: Medium Risk (11/23/2022)   Received from [facility]     Readmission Risk Interventions     No data to display

## 2023-02-21 NOTE — Care Management Important Message (Signed)
Important Message  Patient Details  Name: Jeanette Mora MRN: [id number] Date of Birth: August 18, 1950   Medicare Important Message Given:  Yes  Patient left prior to IM delivery will mail to the patient home address.    Neftali Thurow 02/21/2023, 1:51 PM

## 2023-02-21 NOTE — Progress Notes (Signed)
SATURATION QUALIFICATIONS: (This note is used to comply with regulatory documentation for home oxygen)  Patient Saturations on Room Air at Rest = 92%  Patient Saturations on Room Air while Ambulating = 88%  Patient Saturations on 2 Liters of oxygen while Ambulating = 91%  Please briefly explain why patient needs home oxygen:Unable to maintain spo2 >90% on room air while ambulating

## 2023-06-26 ENCOUNTER — Other Ambulatory Visit: Payer: Self-pay

## 2023-06-26 ENCOUNTER — Encounter (HOSPITAL_COMMUNITY): Payer: Self-pay | Admitting: *Deleted

## 2023-06-26 ENCOUNTER — Emergency Department (HOSPITAL_COMMUNITY)
Admission: EM | Admit: 2023-06-26 | Discharge: 2023-06-26 | Disposition: A | Discharge status: Home / Self Care | Payer: Medicare Other | Attending: Emergency Medicine | Admitting: Emergency Medicine

## 2023-06-26 ENCOUNTER — Emergency Department (HOSPITAL_COMMUNITY): Payer: Medicare Other

## 2023-06-26 DIAGNOSIS — Z7982 Long term (current) use of aspirin: Secondary | ICD-10-CM | POA: Insufficient documentation

## 2023-06-26 DIAGNOSIS — S60222A Contusion of left hand, initial encounter: Secondary | ICD-10-CM | POA: Diagnosis not present

## 2023-06-26 DIAGNOSIS — S6992XA Unspecified injury of left wrist, hand and finger(s), initial encounter: Secondary | ICD-10-CM | POA: Diagnosis present

## 2023-06-26 DIAGNOSIS — Y92019 Unspecified place in single-family (private) house as the place of occurrence of the external cause: Secondary | ICD-10-CM | POA: Insufficient documentation

## 2023-06-26 DIAGNOSIS — W228XXA Striking against or struck by other objects, initial encounter: Secondary | ICD-10-CM | POA: Insufficient documentation
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
## 2023-06-26 NOTE — ED Notes (Signed)
Ice pack placed on injured hand

## 2023-06-26 NOTE — Discharge Instructions (Signed)
You were seen in the ER today for concerns of a hand injury. There was no evidence of any break or other injury to the bones. I suspect this is all bruising from a damaged vein underneath the skin. This bruising will take time to heal and return to normal. Please follow up with your primary care provider for evaluation as needed.

## 2023-06-26 NOTE — ED Provider Notes (Signed)
[place] EMERGENCY DEPARTMENT AT [facility] Provider Note   CSN: [id number] Arrival date & time: 06/26/23  1748     History Chief Complaint  Patient presents with   Hand Injury    Jeanette Mora is a 73 y.o. female.  Patient presents to the emergency department concerns of a hand injury.  She is on aspirin but no other blood thinners.  She states that she hit her hand against a countertop at home.  Notable bruising to the area after this.  Denies any significant pain is able to fully flex and extend all fingers and denies any limitations to mobility.  No other area of pain or injury.   Hand Injury      Home Medications Prior to Admission medications   Medication Sig Start Date End Date Taking? Authorizing Provider  acetaminophen (TYLENOL) 500 MG tablet Take 1,000 mg by mouth daily as needed for moderate pain.    [provider]  ALPRAZolam Prudy Feeler) 1 MG tablet Take 1 mg by mouth in the morning, at noon, and at bedtime. 11/08/17   [provider]  aspirin EC 81 MG tablet Take 81 mg by mouth daily.    [provider]  Calcium-Magnesium-Zinc (CAL-MAG-ZINC PO) Take 1 tablet by mouth daily.    [provider]  gemfibrozil (LOPID) 600 MG tablet Take 600 mg by mouth daily.    [provider]  lidocaine (LIDODERM) 5 % Place 1 patch onto the skin daily. Remove & Discard patch within 12 hours or as directed by MD 02/22/23   Juliet Rude, PA-C  losartan-hydrochlorothiazide (HYZAAR) 100-25 MG tablet Take 1 tablet by mouth daily. 06/10/21   [provider]  mirtazapine (REMERON) 30 MG tablet Take 30 mg by mouth at bedtime.    [provider]  Omega-3 Fatty Acids (FISH OIL PO) Take 1 capsule by mouth daily.    [provider]  polyethylene glycol (MIRALAX / GLYCOLAX) 17 g packet Take 17 g by mouth daily as needed for mild constipation. 02/21/23   Juliet Rude, PA-C  rosuvastatin (CRESTOR) 20 MG tablet Take 20  mg by mouth every morning.     [provider]  traMADol (ULTRAM) 50 MG tablet Take 1-2 tablets (50-100 mg total) by mouth every 6 (six) hours as needed for severe pain or moderate pain. 02/21/23   Juliet Rude, PA-C      Allergies    Oxycodone, Sulfamethoxazole, Tagamet [cimetidine], and Codeine    Review of Systems   Review of Systems  Hematological:  Bruises/bleeds easily.  All other systems reviewed and are negative.   Physical Exam Updated Vital Signs BP 120/67 (BP Location: Right Arm)   Pulse 60   Temp 97.7 F (36.5 C) (Oral)   Resp 16   Ht 5\' 2"  (1.575 m)   Wt 57.6 kg   SpO2 94%   BMI 23.23 kg/m  Physical Exam Vitals and nursing note reviewed.  Constitutional:      General: She is not in acute distress.    Appearance: She is well-developed.  HENT:     Head: Normocephalic and atraumatic.  Eyes:     Conjunctiva/sclera: Conjunctivae normal.  Cardiovascular:     Rate and Rhythm: Normal rate and regular rhythm.     Heart sounds: No murmur heard.    Comments: Neurovascularly intact. Pulmonary:     Effort: Pulmonary effort is normal. No respiratory distress.     Breath sounds: Normal breath sounds.  Abdominal:     Palpations: Abdomen is soft.     Tenderness: There is no abdominal tenderness.  Musculoskeletal:        General: No swelling, tenderness, deformity or signs of injury. Normal range of motion.     Cervical back: Neck supple.     Comments: Patient has symmetric strength in upper extremities.  Able to fully grasp and extend fingers without difficulty.  No visible bony deformity.  Skin:    General: Skin is warm and dry.     Capillary Refill: Capillary refill takes less than 2 seconds.     Findings: Bruising present.          Comments: Notable bruising overlying the dorsum of the left hand.  No focal area of tenderness in this area.  No lacerations or other skin injuries.  Neurological:     Mental Status: She is alert.  Psychiatric:         Mood and Affect: Mood normal.     ED Results / Procedures / Treatments   Labs (all labs ordered are listed, but only abnormal results are displayed) Labs Reviewed - No data to display  EKG None  Radiology DG Hand 2 View Left Result Date: 06/26/2023 CLINICAL DATA:  Hand pain, injury EXAM: LEFT HAND - 2 VIEW COMPARISON:  None Available. FINDINGS: No acute bony abnormality. Specifically, no fracture, subluxation, or dislocation. 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 Soft tissues are intact. IMPRESSION: No acute bony abnormality. Electronically Signed   By: Charlett Nose M.D.   On: 06/26/2023 18:31    Procedures Procedures    Medications Ordered in ED Medications - No data to display  ED Course/ Medical Decision Making/ A&P                                 Medical Decision Making Amount and/or Complexity of Data Reviewed Radiology: ordered.   This patient presents to the ED for concern of hand injury.  Differential diagnosis includes finger dislocation, hematoma, ligamental injury, wrist pain   Imaging Studies ordered:  I ordered imaging studies including x-ray left hand I independently visualized and interpreted imaging which showed no evidence of any acute bony abnormality I agree with the radiologist interpretation   Problem List / ED Course:  Patient presents emergency department concerns of a hand injury.  She reports that she hit her head against a counter earlier today and has notable bruising to the area.  No significant pain or difficulty with movement of the hand.  No wrist pain either.  Given extent of breathing, obtain x-ray imaging to rule out any acute fracture. Physical exam is unremarkable with no tenderness to palpation.  Appears that this is likely breathing potentially secondary to a damaged vessel underneath the skin in this area.  I suspect this is all from a large hematoma. X-ray imaging is negative.  Again feel this is  largely a hematoma.  There is no acute reason to indicate as patient is not having any discomfort from it.  Advised patient this will likely get reabsorbed slowly over time but will continue to looked bruised.  She may continue to apply ice and elevate the limb if needed.  However there is no acute need for more aggressive interventions.  Discussed return precautions such as evidence of infection.  Patient agreed with this current plan and verbalized understanding return precautions.  Discharged home in  stable condition.   Final Clinical Impression(s) / ED Diagnoses Final diagnoses:  Contusion of left hand, initial encounter    Rx / DC Orders ED Discharge Orders     None         Salomon Mast 06/26/23 1952    Gloris Manchester, MD 06/27/23 [phone]

## 2023-09-19 ENCOUNTER — Other Ambulatory Visit (HOSPITAL_COMMUNITY): Payer: Self-pay | Admitting: Family Medicine

## 2023-09-19 DIAGNOSIS — Z1231 Encounter for screening mammogram for malignant neoplasm of breast: Secondary | ICD-10-CM

## 2023-10-27 ENCOUNTER — Encounter (HOSPITAL_COMMUNITY): Payer: Self-pay

## 2023-10-27 ENCOUNTER — Ambulatory Visit (HOSPITAL_COMMUNITY)
Admission: RE | Admit: 2023-10-27 | Discharge: 2023-10-27 | Disposition: A | Discharge status: Home / Self Care | Source: Ambulatory Visit | Attending: Family Medicine | Admitting: Family Medicine

## 2023-10-27 DIAGNOSIS — Z1231 Encounter for screening mammogram for malignant neoplasm of breast: Secondary | ICD-10-CM | POA: Insufficient documentation

## 2023-12-14 ENCOUNTER — Other Ambulatory Visit (HOSPITAL_COMMUNITY): Payer: Self-pay | Admitting: Family Medicine

## 2023-12-14 DIAGNOSIS — Z122 Encounter for screening for malignant neoplasm of respiratory organs: Secondary | ICD-10-CM

## 2023-12-14 DIAGNOSIS — J431 Panlobular emphysema: Secondary | ICD-10-CM

## 2023-12-23 ENCOUNTER — Other Ambulatory Visit (HOSPITAL_COMMUNITY): Payer: Self-pay | Admitting: Family Medicine

## 2023-12-23 DIAGNOSIS — M8580 Other specified disorders of bone density and structure, unspecified site: Secondary | ICD-10-CM

## 2024-01-04 ENCOUNTER — Other Ambulatory Visit (HOSPITAL_COMMUNITY)

## 2024-01-31 ENCOUNTER — Ambulatory Visit (HOSPITAL_COMMUNITY)
Admission: RE | Admit: 2024-01-31 | Discharge: 2024-01-31 | Disposition: A | Discharge status: Home / Self Care | Source: Ambulatory Visit | Attending: Family Medicine | Admitting: Family Medicine

## 2024-01-31 DIAGNOSIS — Z122 Encounter for screening for malignant neoplasm of respiratory organs: Secondary | ICD-10-CM | POA: Insufficient documentation

## 2024-01-31 DIAGNOSIS — M8580 Other specified disorders of bone density and structure, unspecified site: Secondary | ICD-10-CM | POA: Insufficient documentation

## 2024-01-31 DIAGNOSIS — Z13820 Encounter for screening for osteoporosis: Secondary | ICD-10-CM | POA: Diagnosis not present

## 2024-01-31 DIAGNOSIS — F17210 Nicotine dependence, cigarettes, uncomplicated: Secondary | ICD-10-CM | POA: Diagnosis not present

## 2024-01-31 DIAGNOSIS — M810 Age-related osteoporosis without current pathological fracture: Secondary | ICD-10-CM | POA: Insufficient documentation

## 2024-01-31 DIAGNOSIS — I700 Atherosclerosis of aorta: Secondary | ICD-10-CM | POA: Diagnosis not present

## 2024-01-31 DIAGNOSIS — I2510 Atherosclerotic heart disease of native coronary artery without angina pectoris: Secondary | ICD-10-CM | POA: Diagnosis not present

## 2024-01-31 DIAGNOSIS — J431 Panlobular emphysema: Secondary | ICD-10-CM | POA: Diagnosis present

## 2024-06-18 NOTE — Progress Notes (Signed)
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 CareEverywhere, and scanned records) -obtaining and/or reviewing separately obtained history either through past provider notes, patient phone calls, and/or patient's family member(s)/caregiver(s) -performing a medically appropriate examination and/or evaluation -counseling and educating the patient/family/caregiver -ordering medications, tests, or procedures -documenting clinical information in the electronic or other health record -reviewing most up to date studies or expert consensus guidelines for screening/diagnosing/treating pertinent conditions/symptoms -independently interpreting results (not separately reported) and communicating results to the patient/family/caregiver -care coordination (not separately reported) -referring and communicating with other health care professionals (when not separately  reported)   Electronically signed by: Laneta Tanda Agent, PA-C 06/18/2024 12:48 [facility] Medicare Wellness Visit Type:: Subsequent Annual Wellness Visit  Name: Lylliana Kitamura Date of Birth: Jan 27, 1951 Age: 74 y.o. MRN: [id number] Visit Date: 06/18/2024  History obtained from: patient  Living Arrangements/Support System/Health  Assessment/Pain/Stress Marital status: divorced Number of children: 3 Occupation: retired Engineer, agricultural: lives with family Does the patient have a support system (family, friend, church, conservation officer, nature, etc)?: Yes   Do you have any dental concerns?: No In the past month, have you experienced a change in your bladder control?: No   Do you have any difficulty obtaining your medications?: No   Do you have trouble consistently taking or remembering to take all of your medications as prescribed?: No Patient rates overall stress level as: Some Does stress affect daily life?: No Typical amount of pain: none Does pain affect daily life?: No Are you currently prescribed opioids?: No                Depression Screening  Behavioral Health Screening  Patient Health Questionnaire-2 Score: 0 (06/18/2024 10:15 AM)  Patient Health Questionnaire-9 Score: 0 (06/18/2024 10:15 AM)   PHQ Question # 9 Thoughts that you would be better off dead or hurting yourself in some way: Not at all    Patient's Depression screening/score = Negative    Depression Plan: Continue current psychiatric treatment plan    Social History (Tobacco/Drugs/Sexual Activity) Natassia reports that she quit smoking about 4 years ago. Her smoking use included cigarettes. She has never used smokeless tobacco. Tobacco Use?: (!) Yes How many times in the past year have you used a recreational drug or used a prescription medication for nonmedical reasons?: None Risk factors for sexually transmitted infections (i.e., multiple sexual  partners): No Are you bothered by sexual problems?: No  Alcohol  Screening How often do you have a drink containing alcohol ?: Never How many standard drinks containing alcohol  do you have on a typical day?: Never, 1 or 2 drinks How often do you have six or more drinks on one occasion?: Never Audit-C Score: 0  Physical Activity Regular exercise?: Yes Exercise frequency (times per week): 7 Exercise intensity: light (like slow walking)  Diet How many meals a day?: 2 Eats fruit and vegetables daily?: Yes Most meals are obtained by: preparing their own meals, eating out, having others provide food  Home and Transportation Safety All rugs have non-skid backing?: Yes All stairs or steps have railings?: Yes Grab bars in the bathtub or shower?: Yes Have non-skid surface in bathtub or shower?: Yes Good home lighting?: Yes Regular seat belt use?: Yes      Activities of Daily Living Feed self?: Yes Bathe self?: Yes Dress self?: Yes Use toilet without assistance?: Yes Walk without assistance?: Yes    Instrumental Activities of Daily Living Manage finances?: Yes Shop for themselves?: Yes Prepare meals?: Yes Use the telephone?: Yes Manage medications?: Yes   Performs basic housework/laundry?: Yes Drives?: Yes Primary transportation is: driving  Hearing Concerns about hearing?: No Uses hearing aids?: No Hear whispered voice? (Observed): Yes  Vision Concerns about vision?: No    Fall Risk Is the patient ambulatory?: Yes One or more falls in the last year:: No Feels unsteady when walking:: No  Cognitive Assessment Has a diagnosis of dementia or cognitive impairment?: No Are there any memory concerns by the patient, others, or providers?: No              Advance Directives Living will?: (!) No Advance directive information provided to patient: N/A Healthcare POA?: (!) No       Who is your in case of emergency contact?: John Relationship to patient: Adult  child Emergency contact's phone number: on file   Other History I reviewed and updated the  following risk factors and conditions as appropriate: Reviewed/Updated: Problem List, Medical History, Surgical History, Family History, Medications, Allergies Reviewed/Updated: Vital Signs (height, weight, and BP), Immunizations, Health Maintenance Patient Care Team Updated: Done  Vital Signs BP 132/86   Pulse 70   Temp 98.1 F (36.7 C)   Resp 16   Ht 1.575 m (5' 2)   Wt 58.1 kg (128 lb)   SpO2 95%   BMI 23.41 kg/m   Screening and Immunizations Health Maintenance Status       Date Due Completion Dates   ZOSTER VACCINE (2 of 3) 09/22/2010 07/28/2010   DTaP/Tdap/Td Vaccines (2 - Td or Tdap) 11/01/2022 10/31/2012, 05/10/2005   Lung Cancer Screening 08/02/2024 01/31/2024, 12/23/2022   Colorectal Cancer Screening 05/12/2025 05/13/2015, 05/13/2015   COVID-19 Vaccine (7 - 2025-26 season) 09/25/2024 03/27/2024, 02/15/2023   Breast Cancer Screening (Mammogram) 10/26/2024 10/27/2023, 10/27/2023   Diabetes Screening 12/13/2024 12/14/2023, 09/21/2023   Comprehensive Annual Visit 06/18/2025 06/18/2024, 06/18/2024   Depression Monitoring 06/18/2025 06/18/2024       Immunization History  Administered Date(s) Administered   Influenza, High-dose Seasonal, Quadrivalent, Preservative Free 03/09/2019   Influenza, Unspecified 02/05/2018, 02/06/2020   Influenza, high-dose, trivalent, PF 02/15/2023, 02/27/2024   Influenza, split virus, trivalent, preservative 03/07/2016   Influenza,split virus, trivalent, PF 02/11/2017, 02/05/2022   Moderna Covid-19, mRNA,LNP-S,PF 12+ Yrs 02/15/2023, 03/27/2024   Moderna SARS-CoV-2 Primary Series 12+ yrs 08/19/2019, 09/16/2019, 04/06/2020, 11/01/2020   Pneumococcal Conjugate 13-Valent 05/11/2019   Pneumococcal Polysaccharide Vaccine, 23 Valent (PNEUMOVAX-23) 2Y+ 05/28/2020   Pneumococcal, Unspecified 07/28/2010   RSV recombinant,PF(AREXVY)50Y+, not Medicare 03/27/2024    TD vaccine (ADULT)PF(TENIVAC) 7Y+ 05/10/2005   TDAP VACCINE (BOOSTRIX,ADACEL) 7Y+ 10/31/2012   Zoster, Live 07/28/2010    Assessment/Plan: Subsequent Annual Wellness Visit: The topics above were reviewed with the patient.  Healthy lifestyle principles reviewed.  Recommendations provided when indicated.  Follow up 1 year for next wellness visit.  Orders Placed This Encounter  Procedures   MG Breast Screening Tomo Bilat   Lipid Panel   Comprehensive Metabolic Panel   CBC with Differential   Anemia Profile   Hemoglobin A1C With Estimated Average Glucose   Vitamin D, 25-Hydroxy   CBC with Differential   Cologuard colon cancer screening   Ambulatory referral to Gynecologic Oncology    New Medications Ordered This Visit  Medications   cholecalciferol (VITAMIN D3) 1,000 unit (25 mcg) tablet    Sig: Take 1,000 Units by mouth daily.   NON FORMULARY    Sig: Ashwaganda   famotidine (PEPCID) 20 mg tablet    Sig: Take 1 tablet (20 mg total) by mouth 2 (two) times a day as needed for heartburn. Take 30 minutes prior to meals PRN.    Dispense:  180 tablet    Refill:  3   ALPRAZolam  (XANAX ) 1 mg tablet    Sig: Take 1 tablet (1 mg total) by mouth 3 (three) times a day as needed for anxiety.    Dispense:  90 tablet    Refill:  5    Patient Care Team: Laneta Tanda Agent, PA-C as PCP - General (Physician Assistant) Laneta Tanda Agent, PA-C as PCP - Attributed  Electronically signed by: Laneta Tanda Agent, PA-C 06/18/2024 12:48 PM      [1] Past Medical History: Diagnosis Date   Anxiety    Cancer    (CMD)    GERD (gastroesophageal reflux disease)    Hypercholesterolemia    Hypertension   [2] Current Outpatient Medications on File Prior to  Visit  Medication Sig Dispense Refill   acetaminophen  (TYLENOL ) 500 mg tablet Take 1,000 mg by mouth.     aspirin 81 mg EC tablet Take  by mouth Once Daily.     cholecalciferol (VITAMIN D3) 1,000 unit (25 mcg)  tablet Take 1,000 Units by mouth daily.     cholecalciferol (VITAMIN D3) 10 mcg (400 unit) tablet Take  by mouth.     escitalopram (LEXAPRO) 10 mg tablet TAKE 1 TABLET(10 MG) BY MOUTH DAILY 90 tablet 0   gemfibroziL (LOPID) 600 mg tablet TAKE 1 TABLET(600 MG) BY MOUTH DAILY 90 tablet 1   losartan -hydroCHLOROthiazide  (HYZAAR) 100-25 mg per tablet TAKE 1 TABLET BY MOUTH DAILY 90 tablet 0   magnesium 250 mg tablet Take  by mouth.     mirtazapine (REMERON) 15 mg tablet TAKE 1 TABLET(15 MG) BY MOUTH EVERY NIGHT 30 tablet 0   mirtazapine (REMERON) 30 mg tablet TAKE 1 TABLET(30 MG) BY MOUTH AT BEDTIME 90 tablet 3   NON FORMULARY Ashwaganda     omega 3-dha-epa-fish oil (OMEGA 3) 1,000 mg capsule Take 1 g by mouth Once Daily.     rosuvastatin (CRESTOR) 20 mg tablet TAKE 1 TABLET(20 MG) BY MOUTH DAILY 90 tablet 0   [DISCONTINUED] ALPRAZolam  (XANAX ) 1 mg tablet Take 1 tablet (1 mg total) by mouth 3 (three) times a day as needed for anxiety. 90 tablet 5   [DISCONTINUED] calcium carbonate (OS-CAL) 1500 mg (600 mg calcium) tablet Take 1 tablet by mouth Once Daily.     [DISCONTINUED] omeprazole  (PriLOSEC) 20 mg DR capsule Take 1 capsule (20 mg total) by mouth daily. 90 capsule 1   [DISCONTINUED] traMADoL  (ULTRAM ) 50 mg tablet Take 50-100 mg by mouth. (Patient not taking: Reported on 06/18/2024)     No current facility-administered medications on file prior to visit.  [3]  Current Outpatient Medications:    acetaminophen  (TYLENOL ) 500 mg tablet, Take 1,000 mg by mouth., Disp: , Rfl:    aspirin 81 mg EC tablet, Take  by mouth Once Daily., Disp: , Rfl:    cholecalciferol (VITAMIN D3) 1,000 unit (25 mcg) tablet, Take 1,000 Units by mouth daily., Disp: , Rfl:    cholecalciferol (VITAMIN D3) 10 mcg (400 unit) tablet, Take  by mouth., Disp: , Rfl:    escitalopram (LEXAPRO) 10 mg tablet, TAKE 1 TABLET(10 MG) BY MOUTH DAILY, Disp: 90 tablet, Rfl: 0   gemfibroziL (LOPID) 600 mg tablet, TAKE 1  TABLET(600 MG) BY MOUTH DAILY, Disp: 90 tablet, Rfl: 1   losartan -hydroCHLOROthiazide  (HYZAAR) 100-25 mg per tablet, TAKE 1 TABLET BY MOUTH DAILY, Disp: 90 tablet, Rfl: 0   magnesium 250 mg tablet, Take  by mouth., Disp: , Rfl:    mirtazapine (REMERON) 15 mg tablet, TAKE 1 TABLET(15 MG) BY MOUTH EVERY NIGHT, Disp: 30 tablet, Rfl: 0   mirtazapine (REMERON) 30 mg tablet, TAKE 1 TABLET(30 MG) BY MOUTH AT BEDTIME, Disp: 90 tablet, Rfl: 3   NON FORMULARY, Ashwaganda, Disp: , Rfl:    omega 3-dha-epa-fish oil (OMEGA 3) 1,000 mg capsule, Take 1 g by mouth Once Daily., Disp: , Rfl:    rosuvastatin (CRESTOR) 20 mg tablet, TAKE 1 TABLET(20 MG) BY MOUTH DAILY, Disp: 90 tablet, Rfl: 0   ALPRAZolam  (XANAX ) 1 mg tablet, Take 1 tablet (1 mg total) by mouth 3 (three) times a day as needed for anxiety., Disp: 90 tablet, Rfl: 5   famotidine (PEPCID) 20 mg tablet, Take 1 tablet (20 mg total) by mouth 2 (two) times  a day as needed for heartburn. Take 30 minutes prior to meals PRN., Disp: 180 tablet, Rfl: 3 [4] Allergies Allergen Reactions   Cimetidine Rash    unknown   Codeine Rash    unknown   Oxycodone  Rash   Sulfamethoxazole Rash    Unknown  [5] Past Surgical History: Procedure Laterality Date   TUBAL LIGATION     Procedure: TUBAL LIGATION  [6] Family History Problem Relation Name Age of Onset   Alzheimer's disease Mother     Leukemia Brother     Heart disease Brother    [7] Social History Socioeconomic History   Marital status: Divorced  Tobacco Use   Smoking status: Former    Current packs/day: 0.00    Types: Cigarettes    Quit date: 07/25/2019    Years since quitting: 4.9   Smokeless tobacco: Never  Substance and Sexual Activity   Alcohol  use: No   Drug use: Never   Social Drivers of Health   Living Situation: Low Risk (06/18/2024)   Living Situation    What is your living situation today?: I have a steady place to live    Think about the place you live. Do  you have problems with any of the following? Choose all that apply:: None/None on this list  Food Insecurity: Low Risk (06/18/2024)   Food vital sign    Within the past 12 months, you worried that your food would run out before you got money to buy more: Never true    Within the past 12 months, the food you bought just didn't last and you didn't have money to get more: Never true  Transportation Needs: No Transportation Needs (06/18/2024)   Transportation    In the past 12 months, has lack of reliable transportation kept you from medical appointments, meetings, work or from getting things needed for daily living? : No  Utilities: Low Risk (06/18/2024)   Utilities    In the past 12 months has the electric, gas, oil, or water company threatened to shut off services in your home? : No  Safety: Low Risk (06/18/2024)   Safety    How often does anyone, including family and friends, physically hurt you?: Never    How often does anyone, including family and friends, insult or talk down to you?: Never    How often does anyone, including family and friends, threaten you with harm?: Never    How often does anyone, including family and friends, scream or curse at you?: Never  Alcohol  Screening: Not At Risk (06/18/2024)   Alcohol     Audit C Alcohol  risk score: 0  Tobacco Use: Medium Risk (06/18/2024)   Patient History    Smoking Tobacco Use: Former    Smokeless Tobacco Use: Never  Depression: Not At Risk (06/18/2024)   PHQ-2    PHQ-2 Score: 0

## 2024-06-18 NOTE — Progress Notes (Signed)
 Please call the patient to report the following: Cholesterol levels are at goal.  Normal electrolytes, stable kidney function, and normal liver function.  A1c is at 6.6%, which is back into the diabetes range.  However, we do not need to initiate treatment until A1c is higher than 7%.  Continue working on [employer] and incorporate exercise like we discussed at visit today.  We will continue to monitor.  No infection or anemia.  Iron levels remain normal.  You do not need to resume iron supplementation.  Vitamin D is normal.   Continue with current dose of vitamin D.

## 2024-06-21 ENCOUNTER — Emergency Department (HOSPITAL_COMMUNITY)
Admission: EM | Admit: 2024-06-21 | Discharge: 2024-06-22 | Disposition: A | Discharge status: Home / Self Care | Attending: Emergency Medicine | Admitting: Emergency Medicine

## 2024-06-21 ENCOUNTER — Other Ambulatory Visit: Payer: Self-pay

## 2024-06-21 ENCOUNTER — Emergency Department (HOSPITAL_COMMUNITY)

## 2024-06-21 ENCOUNTER — Encounter (HOSPITAL_COMMUNITY): Payer: Self-pay

## 2024-06-21 DIAGNOSIS — Z85828 Personal history of other malignant neoplasm of skin: Secondary | ICD-10-CM | POA: Insufficient documentation

## 2024-06-21 DIAGNOSIS — Z7982 Long term (current) use of aspirin: Secondary | ICD-10-CM | POA: Insufficient documentation

## 2024-06-21 DIAGNOSIS — I10 Essential (primary) hypertension: Secondary | ICD-10-CM | POA: Insufficient documentation

## 2024-06-21 DIAGNOSIS — Z79899 Other long term (current) drug therapy: Secondary | ICD-10-CM | POA: Insufficient documentation

## 2024-06-21 DIAGNOSIS — F419 Anxiety disorder, unspecified: Secondary | ICD-10-CM | POA: Insufficient documentation

## 2024-06-21 DIAGNOSIS — K219 Gastro-esophageal reflux disease without esophagitis: Secondary | ICD-10-CM | POA: Insufficient documentation

## 2024-06-21 LAB — CBC WITH DIFFERENTIAL/PLATELET
Abs Immature Granulocytes: 0.03 K/uL (ref 0.00–0.07)
Basophils Absolute: 0.1 K/uL (ref 0.0–0.1)
Basophils Relative: 1 %
Eosinophils Absolute: 0.4 K/uL (ref 0.0–0.5)
Eosinophils Relative: 4 %
HCT: 42.7 % (ref 36.0–46.0)
Hemoglobin: 14.9 g/dL (ref 12.0–15.0)
Immature Granulocytes: 0 %
Lymphocytes Relative: 28 %
Lymphs Abs: 2.6 K/uL (ref 0.7–4.0)
MCH: 30 pg (ref 26.0–34.0)
MCHC: 34.9 g/dL (ref 30.0–36.0)
MCV: 85.9 fL (ref 80.0–100.0)
Monocytes Absolute: 0.5 K/uL (ref 0.1–1.0)
Monocytes Relative: 5 %
Neutro Abs: 5.7 K/uL (ref 1.7–7.7)
Neutrophils Relative %: 62 %
Platelets: 214 K/uL (ref 150–400)
RBC: 4.97 MIL/uL (ref 3.87–5.11)
RDW: 12.6 % (ref 11.5–15.5)
WBC: 9.2 K/uL (ref 4.0–10.5)
nRBC: 0 % (ref 0.0–0.2)

## 2024-06-21 LAB — TROPONIN T, HIGH SENSITIVITY: Troponin T High Sensitivity: 16 ng/L (ref 0–19)

## 2024-06-21 LAB — COMPREHENSIVE METABOLIC PANEL WITH GFR
ALT: 19 U/L (ref 0–44)
AST: 23 U/L (ref 15–41)
Albumin: 4.5 g/dL (ref 3.5–5.0)
Alkaline Phosphatase: 63 U/L (ref 38–126)
Anion gap: 13 (ref 5–15)
BUN: 30 mg/dL — ABNORMAL HIGH (ref 8–23)
CO2: 27 mmol/L (ref 22–32)
Calcium: 9.6 mg/dL (ref 8.9–10.3)
Chloride: 100 mmol/L (ref 98–111)
Creatinine, Ser: 1.05 mg/dL — ABNORMAL HIGH (ref 0.44–1.00)
GFR, Estimated: 55 mL/min — ABNORMAL LOW
Glucose, Bld: 175 mg/dL — ABNORMAL HIGH (ref 70–99)
Potassium: 3.5 mmol/L (ref 3.5–5.1)
Sodium: 140 mmol/L (ref 135–145)
Total Bilirubin: 0.4 mg/dL (ref 0.0–1.2)
Total Protein: 7.3 g/dL (ref 6.5–8.1)

## 2024-06-21 LAB — LIPASE, BLOOD: Lipase: 44 U/L (ref 11–51)

## 2024-06-21 NOTE — ED Triage Notes (Signed)
 Quick triage note: Pt to ED accompanied with daughter  c/o anxiety; hx of the same. Reports feels like stomach is shaking reports stressor of having a GYN apt tomorrow.

## 2024-06-21 NOTE — ED Provider Triage Note (Signed)
 Emergency Medicine Provider Triage Evaluation Note  Jeanette Mora , a 74 y.o. female  was evaluated in triage.  Pt complains of abdominal fullness.  Notes took a drink of diet Pepsi earlier and felt a fluttering in her stomach.  Denies pain.  States feeling a little shaky now but denies any pain.  States her abdomen does feel bloated.  Denies constipation, has had some diarrhea.  Denies fevers, nausea/vomiting, chest pain, shortness of breath.  Review of Systems  Positive: Abdominal bloating, anxiety Negative: See above  Physical Exam  BP (!) 197/82 (BP Location: Left Arm)   Pulse 72   Temp 97.9 F (36.6 C) (Oral)   Resp (!) 24   SpO2 91%  Gen:   Awake, no distress   Resp:  Normal effort  MSK:   Moves extremities without difficulty  Other:    Medical Decision Making  Medically screening exam initiated at 9:55 PM.  Appropriate orders placed.  Jeanette Mora was informed that the remainder of the evaluation will be completed by another provider, this initial triage assessment does not replace that evaluation, and the importance of remaining in the ED until their evaluation is complete.     [name], PA-C 06/21/24 2200

## 2024-06-21 NOTE — ED Triage Notes (Signed)
 Patient believes she is having an anxiety attack. States when she had a cold drink her stomach started shaking and it went through her whole body. Denies pain. No nausea or vomiting.   Hx: panic attacks (takes xanax )

## 2024-06-22 LAB — TROPONIN T, HIGH SENSITIVITY: Troponin T High Sensitivity: 17 ng/L (ref 0–19)

## 2024-06-22 MED ORDER — OMEPRAZOLE 20 MG PO CPDR: 20.0000 mg | DELAYED_RELEASE_CAPSULE | Freq: Every day | ORAL | 0 refills | Status: AC

## 2024-06-22 NOTE — ED Provider Notes (Signed)
 " [place] EMERGENCY DEPARTMENT AT [facility] Provider Note   CSN: [id number] Arrival date & time: 06/21/24  2127     Patient presents with: Anxiety   Jeanette Mora is a 74 y.o. female.   HPI     This is a 74 year old female who presents with abdominal discomfort and a funny sensation.  Patient reports that she has been feeling anxious over the last several days.  She drank a drink after dinner and began to feel some spasms in her stomach.  She did have some reflux after dinner but no actual vomiting.  Denies pain.  No chest pain or shortness of breath.  She has had normal bowel movements.  She reports in general she has had increasing anxiety.  She has a GYN appointment later today and states that that is making her feel anxious.  Prior to Admission medications  Medication Sig Start Date End Date Taking? Authorizing Provider  omeprazole  (PRILOSEC) 20 MG capsule Take 1 capsule (20 mg total) by mouth daily. 06/22/24  Yes Vivion Romano, Charmaine FALCON, MD  acetaminophen  (TYLENOL ) 500 MG tablet Take 1,000 mg by mouth daily as needed for moderate pain.    [provider]  ALPRAZolam  (XANAX ) 1 MG tablet Take 1 mg by mouth in the morning, at noon, and at bedtime. 11/08/17   [provider]  aspirin EC 81 MG tablet Take 81 mg by mouth daily.    [provider]  Calcium-Magnesium-Zinc (CAL-MAG-ZINC PO) Take 1 tablet by mouth daily.    [provider]  gemfibrozil (LOPID) 600 MG tablet Take 600 mg by mouth daily.    [provider]  lidocaine  (LIDODERM ) 5 % Place 1 patch onto the skin daily. Remove & Discard patch within 12 hours or as directed by MD 02/22/23   Vicci Burnard SAUNDERS, PA-C  losartan -hydrochlorothiazide  (HYZAAR) 100-25 MG tablet Take 1 tablet by mouth daily. 06/10/21   [provider]  mirtazapine (REMERON) 30 MG tablet Take 30 mg by mouth at bedtime.    [provider]  Omega-3 Fatty Acids (FISH OIL PO) Take 1 capsule by  mouth daily.    [provider]  polyethylene glycol (MIRALAX  / GLYCOLAX ) 17 g packet Take 17 g by mouth daily as needed for mild constipation. 02/21/23   Vicci Burnard SAUNDERS, PA-C  rosuvastatin (CRESTOR) 20 MG tablet Take 20 mg by mouth every morning.     [provider]  traMADol  (ULTRAM ) 50 MG tablet Take 1-2 tablets (50-100 mg total) by mouth every 6 (six) hours as needed for severe pain or moderate pain. 02/21/23   Vicci Burnard SAUNDERS, PA-C    Allergies: Oxycodone , Sulfamethoxazole, Tagamet [cimetidine], and Codeine    Review of Systems  Constitutional:  Negative for fever.  Respiratory:  Negative for shortness of breath.   Cardiovascular:  Negative for chest pain.  Gastrointestinal:  Positive for nausea. Negative for abdominal pain and vomiting.  All other systems reviewed and are negative.   Updated Vital Signs BP 121/71   Pulse (!) 55   Temp (!) 97.5 F (36.4 C) (Oral)   Resp 14   SpO2 97%   Physical Exam Vitals and nursing note reviewed.  Constitutional:      Appearance: She is well-developed. She is not ill-appearing.  HENT:     Head: Normocephalic and atraumatic.  Eyes:     Pupils: Pupils are equal, round, and reactive to light.  Cardiovascular:     Rate and Rhythm: Normal rate  and regular rhythm.     Heart sounds: Normal heart sounds.  Pulmonary:     Effort: Pulmonary effort is normal. No respiratory distress.     Breath sounds: No wheezing.  Abdominal:     General: Bowel sounds are normal.     Palpations: Abdomen is soft.     Tenderness: There is no abdominal tenderness. There is no guarding or rebound.  Musculoskeletal:     Cervical back: Neck supple.  Skin:    General: Skin is warm and dry.  Neurological:     Mental Status: She is alert and oriented to person, place, and time.  Psychiatric:        Mood and Affect: Mood normal.     (all labs ordered are listed, but only abnormal results are displayed) Labs Reviewed  COMPREHENSIVE  METABOLIC PANEL WITH GFR - Abnormal; Notable for the following components:      Result Value   Glucose, Bld 175 (*)    BUN 30 (*)    Creatinine, Ser 1.05 (*)    GFR, Estimated 55 (*)    All other components within normal limits  CBC WITH DIFFERENTIAL/PLATELET  LIPASE, BLOOD  TROPONIN T, HIGH SENSITIVITY  TROPONIN T, HIGH SENSITIVITY    EKG: EKG Interpretation Date/Time:  Thursday June 21 2024 22:02:24 EST Ventricular Rate:  68 PR Interval:  138 QRS Duration:  142 QT Interval:  430 QTC Calculation: 457 R Axis:   68  Text Interpretation: Normal sinus rhythm Right bundle branch block Abnormal ECG When compared with ECG of 16-Feb-2023 20:19, PREVIOUS ECG IS PRESENT Confirmed by Bari Pfeiffer ([id number]) on 06/22/2024 12:38:55 AM  Radiology: [facility] Chest 2 View Result Date: 06/21/2024 EXAM: 2 VIEW(S) XRAY OF THE CHEST 06/21/2024 10:29:00 PM COMPARISON: None available. CLINICAL HISTORY: anxiety anxiety anxiety FINDINGS: LUNGS AND PLEURA: Left upper lobe nodule. Emphysema. Nonspecific blunting of bilateral costophrenic angles with trace pleural effusions not excluded. No pneumothorax. HEART AND MEDIASTINUM: No acute abnormality of the cardiac and mediastinal silhouettes. BONES AND SOFT TISSUES: No acute osseous abnormality. IMPRESSION: 1. Left upper lobe nodule. 2. Possible trace pleural effusions. Electronically signed by: Morgane Naveau MD 06/21/2024 10:35 PM EST RP Workstation: [id number]     Procedures   Medications Ordered in the ED - No data to display                                  Medical Decision Making Risk Prescription drug management.   This patient presents to the ED for concern of abdominal fullness, discomfort, this involves an extensive number of treatment options, and is a complaint that carries with it a high risk of complications and morbidity.  I considered the following differential and admission for this acute, potentially life threatening condition.  The  differential diagnosis includes reflux, pancreatitis, esophagitis, gastritis, cholecystitis, less likely ACS  MDM:    This is a 74 year old female who had an episode of abdominal fullness and discomfort after drinking.  Reports tingling after this episode as well.  She is overall nontoxic and vital signs are reassuring.  Initially she was hypertensive but that resolved without intervention.  Currently she states she is at her baseline.  No chest pain or shortness of breath.  EKG reassuring.  Labs including liver function testing and lipase are all reassuring.  She does describe some symptoms that would be consistent with reflux.  She is supposed to be on an antacid  daily but does not take it regularly.  Will trial omeprazole .  She also endorses some anxiety.  Appears calm at this time.  Discussed with her that she needs to follow-up with her primary doctor if she has ongoing or debilitating anxiety.  (Labs, imaging, consults)  Labs: I Ordered, and personally interpreted labs.  The pertinent results include: CBC, CMP, lipase, troponin  Imaging Studies ordered: I ordered imaging studies including chest x-ray I independently visualized and interpreted imaging. I agree with the radiologist interpretation  Additional history obtained from daughter at bedside.  External records from outside source obtained and reviewed including prior evaluations  Cardiac Monitoring: The patient was maintained on a cardiac monitor.  If on the cardiac monitor, I personally viewed and interpreted the cardiac monitored which showed an underlying rhythm of: Sinus  Reevaluation: After the interventions noted above, I reevaluated the patient and found that they have :resolved  Social Determinants of Health:  lives independently  Disposition: Discharge  Co morbidities that complicate the patient evaluation  Past Medical History:  Diagnosis Date   Anxiety    Depression    Dyslipidemia    Family history of  adverse reaction to anesthesia    daughter- ponv   Full dentures    History of basal cell carcinoma (BCC) excision    bcc lesion from at left eye;  and  face   History of epidermal inclusion cyst excision    nose   Hypertension    Pre-diabetes    Rotator cuff arthropathy of right shoulder    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    Dispense:  30 capsule    Refill:  0    I have reviewed the patients home medicines and have made adjustments as needed  Problem List / ED Course: Problem List Items Addressed This Visit   None Visit Diagnoses       Gastroesophageal reflux disease without esophagitis    -  Primary   Relevant Medications   omeprazole  (PRILOSEC) 20 MG capsule     Anxiety                    Final diagnoses:  Gastroesophageal reflux disease without esophagitis  Anxiety    ED Discharge Orders          Ordered    omeprazole  (PRILOSEC) 20 MG capsule  Daily        06/22/24 0123               Bari Charmaine FALCON, MD 06/22/24 0134  "

## 2024-06-22 NOTE — Discharge Instructions (Signed)
 You were seen today for anxiety and some reflux symptoms.  Your workup including heart testing is reassuring.  Make sure you follow-up with your primary doctor.  If you note that your anxiety becomes daily or is overwhelming, you may need to start a long-term medication.  Regarding reflux symptoms, start omeprazole  daily and follow-up with your primary doctor.
